# Patient Record
Sex: Female | Born: 1950 | Race: White | Hispanic: No | Marital: Married | Smoking: Never smoker
Health system: Southern US, Community
[De-identification: ages and names within clinical notes are randomized; demographics above are authoritative.]

## PROBLEM LIST (undated history)

## (undated) DIAGNOSIS — C4491 Basal cell carcinoma of skin, unspecified: Secondary | ICD-10-CM

## (undated) DIAGNOSIS — M8580 Other specified disorders of bone density and structure, unspecified site: Secondary | ICD-10-CM

## (undated) DIAGNOSIS — B019 Varicella without complication: Secondary | ICD-10-CM

## (undated) DIAGNOSIS — C439 Malignant melanoma of skin, unspecified: Secondary | ICD-10-CM

## (undated) DIAGNOSIS — R011 Cardiac murmur, unspecified: Secondary | ICD-10-CM

## (undated) DIAGNOSIS — K219 Gastro-esophageal reflux disease without esophagitis: Secondary | ICD-10-CM

## (undated) HISTORY — PX: BLADDER SUSPENSION: SHX72

## (undated) HISTORY — PX: ABDOMINAL HYSTERECTOMY: SHX81

## (undated) HISTORY — PX: SKIN CANCER EXCISION: SHX779

## (undated) HISTORY — DX: Varicella without complication: B01.9

## (undated) HISTORY — DX: Gastro-esophageal reflux disease without esophagitis: K21.9

## (undated) HISTORY — DX: Malignant melanoma of skin, unspecified: C43.9

## (undated) HISTORY — DX: Other specified disorders of bone density and structure, unspecified site: M85.80

## (undated) HISTORY — DX: Basal cell carcinoma of skin, unspecified: C44.91

## (undated) HISTORY — PX: COLONOSCOPY: SHX174

## (undated) HISTORY — DX: Cardiac murmur, unspecified: R01.1

---

## 2014-02-08 DIAGNOSIS — L821 Other seborrheic keratosis: Secondary | ICD-10-CM | POA: Diagnosis not present

## 2014-02-08 DIAGNOSIS — D229 Melanocytic nevi, unspecified: Secondary | ICD-10-CM | POA: Diagnosis not present

## 2014-02-08 DIAGNOSIS — Z85828 Personal history of other malignant neoplasm of skin: Secondary | ICD-10-CM | POA: Diagnosis not present

## 2014-02-08 DIAGNOSIS — L570 Actinic keratosis: Secondary | ICD-10-CM | POA: Diagnosis not present

## 2014-02-08 DIAGNOSIS — L738 Other specified follicular disorders: Secondary | ICD-10-CM | POA: Diagnosis not present

## 2014-03-23 ENCOUNTER — Encounter: Payer: Self-pay | Admitting: Family Medicine

## 2014-03-23 ENCOUNTER — Ambulatory Visit (INDEPENDENT_AMBULATORY_CARE_PROVIDER_SITE_OTHER): Payer: Medicare Other | Admitting: Family Medicine

## 2014-03-23 ENCOUNTER — Other Ambulatory Visit (HOSPITAL_COMMUNITY)
Admission: RE | Admit: 2014-03-23 | Discharge: 2014-03-23 | Disposition: A | Discharge status: Home / Self Care | Payer: Medicare Other | Source: Ambulatory Visit | Attending: Family Medicine | Admitting: Family Medicine

## 2014-03-23 VITALS — BP 132/76 | HR 73 | Temp 98.3°F | Ht 61.5 in | Wt 156.0 lb

## 2014-03-23 DIAGNOSIS — Z124 Encounter for screening for malignant neoplasm of cervix: Secondary | ICD-10-CM

## 2014-03-23 DIAGNOSIS — Z1231 Encounter for screening mammogram for malignant neoplasm of breast: Secondary | ICD-10-CM

## 2014-03-23 DIAGNOSIS — Z1151 Encounter for screening for human papillomavirus (HPV): Secondary | ICD-10-CM | POA: Insufficient documentation

## 2014-03-23 DIAGNOSIS — K589 Irritable bowel syndrome without diarrhea: Secondary | ICD-10-CM | POA: Diagnosis not present

## 2014-03-23 DIAGNOSIS — E2839 Other primary ovarian failure: Secondary | ICD-10-CM

## 2014-03-23 DIAGNOSIS — N819 Female genital prolapse, unspecified: Secondary | ICD-10-CM | POA: Diagnosis not present

## 2014-03-23 DIAGNOSIS — Z23 Encounter for immunization: Secondary | ICD-10-CM

## 2014-03-23 DIAGNOSIS — Z136 Encounter for screening for cardiovascular disorders: Secondary | ICD-10-CM

## 2014-03-23 DIAGNOSIS — Z0000 Encounter for general adult medical examination without abnormal findings: Secondary | ICD-10-CM

## 2014-03-23 DIAGNOSIS — B351 Tinea unguium: Secondary | ICD-10-CM

## 2014-03-23 DIAGNOSIS — IMO0002 Reserved for concepts with insufficient information to code with codable children: Secondary | ICD-10-CM

## 2014-03-23 MED ORDER — ZOSTER VACCINE LIVE 19400 UNT/0.65ML ~~LOC~~ SOLR: 0.6500 mL | Freq: Once | SUBCUTANEOUS | Status: DC

## 2014-03-23 MED ORDER — EFINACONAZOLE 10 % EX SOLN: CUTANEOUS | Status: DC

## 2014-03-23 NOTE — Patient Instructions (Signed)
Preventive Care for Adults A healthy lifestyle and preventive care can promote health and wellness. Preventive health guidelines for women include the following key practices.  A routine yearly physical is a good way to check with your health care provider about your health and preventive screening. It is a chance to share any concerns and updates on your health and to receive a thorough exam.  Visit your dentist for a routine exam and preventive care every 6 months. Brush your teeth twice a day and floss once a day. Good oral hygiene prevents tooth decay and gum disease.  The frequency of eye exams is based on your age, health, family medical history, use of contact lenses, and other factors. Follow your health care provider's recommendations for frequency of eye exams.  Eat a healthy diet. Foods like vegetables, fruits, whole grains, low-fat dairy products, and lean protein foods contain the nutrients you need without too many calories. Decrease your intake of foods high in solid fats, added sugars, and salt. Eat the right amount of calories for you.Get information about a proper diet from your health care provider, if necessary.  Regular physical exercise is one of the most important things you can do for your health. Most adults should get at least 150 minutes of moderate-intensity exercise (any activity that increases your heart rate and causes you to sweat) each week. In addition, most adults need muscle-strengthening exercises on 2 or more days a week.  Maintain a healthy weight. The body mass index (BMI) is a screening tool to identify possible weight problems. It provides an estimate of body fat based on height and weight. Your health care provider can find your BMI and can help you achieve or maintain a healthy weight.For adults 20 years and older:  A BMI below 18.5 is considered underweight.  A BMI of 18.5 to 24.9 is normal.  A BMI of 25 to 29.9 is considered overweight.  A BMI of  30 and above is considered obese.  Maintain normal blood lipids and cholesterol levels by exercising and minimizing your intake of saturated fat. Eat a balanced diet with plenty of fruit and vegetables. Blood tests for lipids and cholesterol should begin at age 76 and be repeated every 5 years. If your lipid or cholesterol levels are high, you are over 50, or you are at high risk for heart disease, you may need your cholesterol levels checked more frequently.Ongoing high lipid and cholesterol levels should be treated with medicines if diet and exercise are not working.  If you smoke, find out from your health care provider how to quit. If you do not use tobacco, do not start.  Lung cancer screening is recommended for adults aged 22-80 years who are at high risk for developing lung cancer because of a history of smoking. A yearly low-dose CT scan of the lungs is recommended for people who have at least a 30-pack-year history of smoking and are a current smoker or have quit within the past 15 years. A pack year of smoking is smoking an average of 1 pack of cigarettes a day for 1 year (for example: 1 pack a day for 30 years or 2 packs a day for 15 years). Yearly screening should continue until the smoker has stopped smoking for at least 15 years. Yearly screening should be stopped for people who develop a health problem that would prevent them from having lung cancer treatment.  If you are pregnant, do not drink alcohol. If you are breastfeeding,  be very cautious about drinking alcohol. If you are not pregnant and choose to drink alcohol, do not have more than 1 drink per day. One drink is considered to be 12 ounces (355 mL) of beer, 5 ounces (148 mL) of wine, or 1.5 ounces (44 mL) of liquor.  Avoid use of street drugs. Do not share needles with anyone. Ask for help if you need support or instructions about stopping the use of drugs.  High blood pressure causes heart disease and increases the risk of  stroke. Your blood pressure should be checked at least every 1 to 2 years. Ongoing high blood pressure should be treated with medicines if weight loss and exercise do not work.  If you are 75-52 years old, ask your health care provider if you should take aspirin to prevent strokes.  Diabetes screening involves taking a blood sample to check your fasting blood sugar level. This should be done once every 3 years, after age 15, if you are within normal weight and without risk factors for diabetes. Testing should be considered at a younger age or be carried out more frequently if you are overweight and have at least 1 risk factor for diabetes.  Breast cancer screening is essential preventive care for women. You should practice "breast self-awareness." This means understanding the normal appearance and feel of your breasts and may include breast self-examination. Any changes detected, no matter how small, should be reported to a health care provider. Women in their 58s and 30s should have a clinical breast exam (CBE) by a health care provider as part of a regular health exam every 1 to 3 years. After age 16, women should have a CBE every year. Starting at age 53, women should consider having a mammogram (breast X-ray test) every year. Women who have a family history of breast cancer should talk to their health care provider about genetic screening. Women at a high risk of breast cancer should talk to their health care providers about having an MRI and a mammogram every year.  Breast cancer gene (BRCA)-related cancer risk assessment is recommended for women who have family members with BRCA-related cancers. BRCA-related cancers include breast, ovarian, tubal, and peritoneal cancers. Having family members with these cancers may be associated with an increased risk for harmful changes (mutations) in the breast cancer genes BRCA1 and BRCA2. Results of the assessment will determine the need for genetic counseling and  BRCA1 and BRCA2 testing.  Routine pelvic exams to screen for cancer are no longer recommended for nonpregnant women who are considered low risk for cancer of the pelvic organs (ovaries, uterus, and vagina) and who do not have symptoms. Ask your health care provider if a screening pelvic exam is right for you.  If you have had past treatment for cervical cancer or a condition that could lead to cancer, you need Pap tests and screening for cancer for at least 20 years after your treatment. If Pap tests have been discontinued, your risk factors (such as having a new sexual partner) need to be reassessed to determine if screening should be resumed. Some women have medical problems that increase the chance of getting cervical cancer. In these cases, your health care provider may recommend more frequent screening and Pap tests.  The HPV test is an additional test that may be used for cervical cancer screening. The HPV test looks for the virus that can cause the cell changes on the cervix. The cells collected during the Pap test can be  tested for HPV. The HPV test could be used to screen women aged 30 years and older, and should be used in women of any age who have unclear Pap test results. After the age of 30, women should have HPV testing at the same frequency as a Pap test.  Colorectal cancer can be detected and often prevented. Most routine colorectal cancer screening begins at the age of 50 years and continues through age 75 years. However, your health care provider may recommend screening at an earlier age if you have risk factors for colon cancer. On a yearly basis, your health care provider may provide home test kits to check for hidden blood in the stool. Use of a small camera at the end of a tube, to directly examine the colon (sigmoidoscopy or colonoscopy), can detect the earliest forms of colorectal cancer. Talk to your health care provider about this at age 50, when routine screening begins. Direct  exam of the colon should be repeated every 5-10 years through age 75 years, unless early forms of pre-cancerous polyps or small growths are found.  People who are at an increased risk for hepatitis B should be screened for this virus. You are considered at high risk for hepatitis B if:  You were born in a country where hepatitis B occurs often. Talk with your health care provider about which countries are considered high risk.  Your parents were born in a high-risk country and you have not received a shot to protect against hepatitis B (hepatitis B vaccine).  You have HIV or AIDS.  You use needles to inject street drugs.  You live with, or have sex with, someone who has hepatitis B.  You get hemodialysis treatment.  You take certain medicines for conditions like cancer, organ transplantation, and autoimmune conditions.  Hepatitis C blood testing is recommended for all people born from 1945 through 1965 and any individual with known risks for hepatitis C.  Practice safe sex. Use condoms and avoid high-risk sexual practices to reduce the spread of sexually transmitted infections (STIs). STIs include gonorrhea, chlamydia, syphilis, trichomonas, herpes, HPV, and human immunodeficiency virus (HIV). Herpes, HIV, and HPV are viral illnesses that have no cure. They can result in disability, cancer, and death.  You should be screened for sexually transmitted illnesses (STIs) including gonorrhea and chlamydia if:  You are sexually active and are younger than 24 years.  You are older than 24 years and your health care provider tells you that you are at risk for this type of infection.  Your sexual activity has changed since you were last screened and you are at an increased risk for chlamydia or gonorrhea. Ask your health care provider if you are at risk.  If you are at risk of being infected with HIV, it is recommended that you take a prescription medicine daily to prevent HIV infection. This is  called preexposure prophylaxis (PrEP). You are considered at risk if:  You are a heterosexual woman, are sexually active, and are at increased risk for HIV infection.  You take drugs by injection.  You are sexually active with a partner who has HIV.  Talk with your health care provider about whether you are at high risk of being infected with HIV. If you choose to begin PrEP, you should first be tested for HIV. You should then be tested every 3 months for as long as you are taking PrEP.  Osteoporosis is a disease in which the bones lose minerals and strength   with aging. This can result in serious bone fractures or breaks. The risk of osteoporosis can be identified using a bone density scan. Women ages 65 years and over and women at risk for fractures or osteoporosis should discuss screening with their health care providers. Ask your health care provider whether you should take a calcium supplement or vitamin D to reduce the rate of osteoporosis.  Menopause can be associated with physical symptoms and risks. Hormone replacement therapy is available to decrease symptoms and risks. You should talk to your health care provider about whether hormone replacement therapy is right for you.  Use sunscreen. Apply sunscreen liberally and repeatedly throughout the day. You should seek shade when your shadow is shorter than you. Protect yourself by wearing long sleeves, pants, a wide-brimmed hat, and sunglasses year round, whenever you are outdoors.  Once a month, do a whole body skin exam, using a mirror to look at the skin on your back. Tell your health care provider of new moles, moles that have irregular borders, moles that are larger than a pencil eraser, or moles that have changed in shape or color.  Stay current with required vaccines (immunizations).  Influenza vaccine. All adults should be immunized every year.  Tetanus, diphtheria, and acellular pertussis (Td, Tdap) vaccine. Pregnant women should  receive 1 dose of Tdap vaccine during each pregnancy. The dose should be obtained regardless of the length of time since the last dose. Immunization is preferred during the 27th-36th week of gestation. An adult who has not previously received Tdap or who does not know her vaccine status should receive 1 dose of Tdap. This initial dose should be followed by tetanus and diphtheria toxoids (Td) booster doses every 10 years. Adults with an unknown or incomplete history of completing a 3-dose immunization series with Td-containing vaccines should begin or complete a primary immunization series including a Tdap dose. Adults should receive a Td booster every 10 years.  Varicella vaccine. An adult without evidence of immunity to varicella should receive 2 doses or a second dose if she has previously received 1 dose. Pregnant females who do not have evidence of immunity should receive the first dose after pregnancy. This first dose should be obtained before leaving the health care facility. The second dose should be obtained 4-8 weeks after the first dose.  Human papillomavirus (HPV) vaccine. Females aged 13-26 years who have not received the vaccine previously should obtain the 3-dose series. The vaccine is not recommended for use in pregnant females. However, pregnancy testing is not needed before receiving a dose. If a female is found to be pregnant after receiving a dose, no treatment is needed. In that case, the remaining doses should be delayed until after the pregnancy. Immunization is recommended for any person with an immunocompromised condition through the age of 26 years if she did not get any or all doses earlier. During the 3-dose series, the second dose should be obtained 4-8 weeks after the first dose. The third dose should be obtained 24 weeks after the first dose and 16 weeks after the second dose.  Zoster vaccine. One dose is recommended for adults aged 60 years or older unless certain conditions are  present.  Measles, mumps, and rubella (MMR) vaccine. Adults born before 1957 generally are considered immune to measles and mumps. Adults born in 1957 or later should have 1 or more doses of MMR vaccine unless there is a contraindication to the vaccine or there is laboratory evidence of immunity to   each of the three diseases. A routine second dose of MMR vaccine should be obtained at least 28 days after the first dose for students attending postsecondary schools, health care workers, or international travelers. People who received inactivated measles vaccine or an unknown type of measles vaccine during 1963-1967 should receive 2 doses of MMR vaccine. People who received inactivated mumps vaccine or an unknown type of mumps vaccine before 1979 and are at high risk for mumps infection should consider immunization with 2 doses of MMR vaccine. For females of childbearing age, rubella immunity should be determined. If there is no evidence of immunity, females who are not pregnant should be vaccinated. If there is no evidence of immunity, females who are pregnant should delay immunization until after pregnancy. Unvaccinated health care workers born before 1957 who lack laboratory evidence of measles, mumps, or rubella immunity or laboratory confirmation of disease should consider measles and mumps immunization with 2 doses of MMR vaccine or rubella immunization with 1 dose of MMR vaccine.  Pneumococcal 13-valent conjugate (PCV13) vaccine. When indicated, a person who is uncertain of her immunization history and has no record of immunization should receive the PCV13 vaccine. An adult aged 19 years or older who has certain medical conditions and has not been previously immunized should receive 1 dose of PCV13 vaccine. This PCV13 should be followed with a dose of pneumococcal polysaccharide (PPSV23) vaccine. The PPSV23 vaccine dose should be obtained at least 8 weeks after the dose of PCV13 vaccine. An adult aged 19  years or older who has certain medical conditions and previously received 1 or more doses of PPSV23 vaccine should receive 1 dose of PCV13. The PCV13 vaccine dose should be obtained 1 or more years after the last PPSV23 vaccine dose.  Pneumococcal polysaccharide (PPSV23) vaccine. When PCV13 is also indicated, PCV13 should be obtained first. All adults aged 65 years and older should be immunized. An adult younger than age 65 years who has certain medical conditions should be immunized. Any person who resides in a nursing home or long-term care facility should be immunized. An adult smoker should be immunized. People with an immunocompromised condition and certain other conditions should receive both PCV13 and PPSV23 vaccines. People with human immunodeficiency virus (HIV) infection should be immunized as soon as possible after diagnosis. Immunization during chemotherapy or radiation therapy should be avoided. Routine use of PPSV23 vaccine is not recommended for American Indians, [place] Natives, or people younger than 65 years unless there are medical conditions that require PPSV23 vaccine. When indicated, people who have unknown immunization and have no record of immunization should receive PPSV23 vaccine. One-time revaccination 5 years after the first dose of PPSV23 is recommended for people aged 19-64 years who have chronic kidney failure, nephrotic syndrome, asplenia, or immunocompromised conditions. People who received 1-2 doses of PPSV23 before age 65 years should receive another dose of PPSV23 vaccine at age 65 years or later if at least 5 years have passed since the previous dose. Doses of PPSV23 are not needed for people immunized with PPSV23 at or after age 65 years.  Meningococcal vaccine. Adults with asplenia or persistent complement component deficiencies should receive 2 doses of quadrivalent meningococcal conjugate (MenACWY-D) vaccine. The doses should be obtained at least 2 months apart.  Microbiologists working with certain meningococcal bacteria, military recruits, people at risk during an outbreak, and people who travel to or live in countries with a high rate of meningitis should be immunized. A first-year college student up through age   21 years who is living in a residence hall should receive a dose if she did not receive a dose on or after her 16th birthday. Adults who have certain high-risk conditions should receive one or more doses of vaccine.  Hepatitis A vaccine. Adults who wish to be protected from this disease, have certain high-risk conditions, work with hepatitis A-infected animals, work in hepatitis A research labs, or travel to or work in countries with a high rate of hepatitis A should be immunized. Adults who were previously unvaccinated and who anticipate close contact with an international adoptee during the first 60 days after arrival in the [place] from a country with a high rate of hepatitis A should be immunized.  Hepatitis B vaccine. Adults who wish to be protected from this disease, have certain high-risk conditions, may be exposed to blood or other infectious body fluids, are household contacts or sex partners of hepatitis B positive people, are clients or workers in certain care facilities, or travel to or work in countries with a high rate of hepatitis B should be immunized.  Haemophilus influenzae type b (Hib) vaccine. A previously unvaccinated person with asplenia or sickle cell disease or having a scheduled splenectomy should receive 1 dose of Hib vaccine. Regardless of previous immunization, a recipient of a hematopoietic stem cell transplant should receive a 3-dose series 6-12 months after her successful transplant. Hib vaccine is not recommended for adults with HIV infection. Preventive Services / Frequency Ages 64 to 68 years  Blood pressure check.** / Every 1 to 2 years.  Lipid and cholesterol check.** / Every 5 years beginning at age  22.  Clinical breast exam.** / Every 3 years for women in their 88s and 53s.  BRCA-related cancer risk assessment.** / For women who have family members with a BRCA-related cancer (breast, ovarian, tubal, or peritoneal cancers).  Pap test.** / Every 2 years from ages 90 through 51. Every 3 years starting at age 21 through age 56 or 3 with a history of 3 consecutive normal Pap tests.  HPV screening.** / Every 3 years from ages 24 through ages 1 to 46 with a history of 3 consecutive normal Pap tests.  Hepatitis C blood test.** / For any individual with known risks for hepatitis C.  Skin self-exam. / Monthly.  Influenza vaccine. / Every year.  Tetanus, diphtheria, and acellular pertussis (Tdap, Td) vaccine.** / Consult your health care provider. Pregnant women should receive 1 dose of Tdap vaccine during each pregnancy. 1 dose of Td every 10 years.  Varicella vaccine.** / Consult your health care provider. Pregnant females who do not have evidence of immunity should receive the first dose after pregnancy.  HPV vaccine. / 3 doses over 6 months, if 72 and younger. The vaccine is not recommended for use in pregnant females. However, pregnancy testing is not needed before receiving a dose.  Measles, mumps, rubella (MMR) vaccine.** / You need at least 1 dose of MMR if you were born in 1957 or later. You may also need a 2nd dose. For females of childbearing age, rubella immunity should be determined. If there is no evidence of immunity, females who are not pregnant should be vaccinated. If there is no evidence of immunity, females who are pregnant should delay immunization until after pregnancy.  Pneumococcal 13-valent conjugate (PCV13) vaccine.** / Consult your health care provider.  Pneumococcal polysaccharide (PPSV23) vaccine.** / 1 to 2 doses if you smoke cigarettes or if you have certain conditions.  Meningococcal vaccine.** /  1 dose if you are age 19 to 21 years and a first-year college  student living in a residence hall, or have one of several medical conditions, you need to get vaccinated against meningococcal disease. You may also need additional booster doses.  Hepatitis A vaccine.** / Consult your health care provider.  Hepatitis B vaccine.** / Consult your health care provider.  Haemophilus influenzae type b (Hib) vaccine.** / Consult your health care provider. Ages 40 to 64 years  Blood pressure check.** / Every 1 to 2 years.  Lipid and cholesterol check.** / Every 5 years beginning at age 20 years.  Lung cancer screening. / Every year if you are aged 55-80 years and have a 30-pack-year history of smoking and currently smoke or have quit within the past 15 years. Yearly screening is stopped once you have quit smoking for at least 15 years or develop a health problem that would prevent you from having lung cancer treatment.  Clinical breast exam.** / Every year after age 40 years.  BRCA-related cancer risk assessment.** / For women who have family members with a BRCA-related cancer (breast, ovarian, tubal, or peritoneal cancers).  Mammogram.** / Every year beginning at age 40 years and continuing for as long as you are in good health. Consult with your health care provider.  Pap test.** / Every 3 years starting at age 30 years through age 65 or 70 years with a history of 3 consecutive normal Pap tests.  HPV screening.** / Every 3 years from ages 30 years through ages 65 to 70 years with a history of 3 consecutive normal Pap tests.  Fecal occult blood test (FOBT) of stool. / Every year beginning at age 50 years and continuing until age 75 years. You may not need to do this test if you get a colonoscopy every 10 years.  Flexible sigmoidoscopy or colonoscopy.** / Every 5 years for a flexible sigmoidoscopy or every 10 years for a colonoscopy beginning at age 50 years and continuing until age 75 years.  Hepatitis C blood test.** / For all people born from 1945 through  1965 and any individual with known risks for hepatitis C.  Skin self-exam. / Monthly.  Influenza vaccine. / Every year.  Tetanus, diphtheria, and acellular pertussis (Tdap/Td) vaccine.** / Consult your health care provider. Pregnant women should receive 1 dose of Tdap vaccine during each pregnancy. 1 dose of Td every 10 years.  Varicella vaccine.** / Consult your health care provider. Pregnant females who do not have evidence of immunity should receive the first dose after pregnancy.  Zoster vaccine.** / 1 dose for adults aged 60 years or older.  Measles, mumps, rubella (MMR) vaccine.** / You need at least 1 dose of MMR if you were born in 1957 or later. You may also need a 2nd dose. For females of childbearing age, rubella immunity should be determined. If there is no evidence of immunity, females who are not pregnant should be vaccinated. If there is no evidence of immunity, females who are pregnant should delay immunization until after pregnancy.  Pneumococcal 13-valent conjugate (PCV13) vaccine.** / Consult your health care provider.  Pneumococcal polysaccharide (PPSV23) vaccine.** / 1 to 2 doses if you smoke cigarettes or if you have certain conditions.  Meningococcal vaccine.** / Consult your health care provider.  Hepatitis A vaccine.** / Consult your health care provider.  Hepatitis B vaccine.** / Consult your health care provider.  Haemophilus influenzae type b (Hib) vaccine.** / Consult your health care provider. Ages 65   years and over  Blood pressure check.** / Every 1 to 2 years.  Lipid and cholesterol check.** / Every 5 years beginning at age 22 years.  Lung cancer screening. / Every year if you are aged 73-80 years and have a 30-pack-year history of smoking and currently smoke or have quit within the past 15 years. Yearly screening is stopped once you have quit smoking for at least 15 years or develop a health problem that would prevent you from having lung cancer  treatment.  Clinical breast exam.** / Every year after age 4 years.  BRCA-related cancer risk assessment.** / For women who have family members with a BRCA-related cancer (breast, ovarian, tubal, or peritoneal cancers).  Mammogram.** / Every year beginning at age 40 years and continuing for as long as you are in good health. Consult with your health care provider.  Pap test.** / Every 3 years starting at age 9 years through age 34 or 91 years with 3 consecutive normal Pap tests. Testing can be stopped between 65 and 70 years with 3 consecutive normal Pap tests and no abnormal Pap or HPV tests in the past 10 years.  HPV screening.** / Every 3 years from ages 57 years through ages 64 or 45 years with a history of 3 consecutive normal Pap tests. Testing can be stopped between 65 and 70 years with 3 consecutive normal Pap tests and no abnormal Pap or HPV tests in the past 10 years.  Fecal occult blood test (FOBT) of stool. / Every year beginning at age 15 years and continuing until age 17 years. You may not need to do this test if you get a colonoscopy every 10 years.  Flexible sigmoidoscopy or colonoscopy.** / Every 5 years for a flexible sigmoidoscopy or every 10 years for a colonoscopy beginning at age 86 years and continuing until age 71 years.  Hepatitis C blood test.** / For all people born from [birth year] through 1967 and any individual with known risks for hepatitis C.  Osteoporosis screening.** / A one-time screening for women ages 83 years and over and women at risk for fractures or osteoporosis.  Skin self-exam. / Monthly.  Influenza vaccine. / Every year.  Tetanus, diphtheria, and acellular pertussis (Tdap/Td) vaccine.** / 1 dose of Td every 10 years.  Varicella vaccine.** / Consult your health care provider.  Zoster vaccine.** / 1 dose for adults aged 61 years or older.  Pneumococcal 13-valent conjugate (PCV13) vaccine.** / Consult your health care provider.  Pneumococcal  polysaccharide (PPSV23) vaccine.** / 1 dose for all adults aged 28 years and older.  Meningococcal vaccine.** / Consult your health care provider.  Hepatitis A vaccine.** / Consult your health care provider.  Hepatitis B vaccine.** / Consult your health care provider.  Haemophilus influenzae type b (Hib) vaccine.** / Consult your health care provider. ** Family history and personal history of risk and conditions may change your health care provider's recommendations. Document Released: 04/22/2001 Document Revised: 07/11/2013 Document Reviewed: 07/22/2010 [facility] Patient Information 2015 [place], [place]. This information is not intended to replace advice given to you by your health care provider. Make sure you discuss any questions you have with your health care provider.

## 2014-03-23 NOTE — Progress Notes (Signed)
Subjective:    Margaret Foster is a 64 y.o. female who presents for Medicare Annual/Subsequent preventive examination.  Preventive Screening-Counseling & Management  Tobacco History  Smoking status  . Never Smoker   Smokeless tobacco  . Never Used     Problems Prior to Visit 1.   Current Problems (verified) Patient Active Problem List   Diagnosis Date Noted  . Pelvic prolapse 03/23/2014  . IBS (irritable bowel syndrome) 03/23/2014    Medications Prior to Visit No current outpatient prescriptions on file prior to visit.   No current facility-administered medications on file prior to visit.    Current Medications (verified) Current Outpatient Prescriptions  Medication Sig Dispense Refill  . aspirin 325 MG tablet Take 325 mg by mouth daily.    . Efinaconazole (JUBLIA) 10 % SOLN Apply once daily for 48 weeks 8 mL 3  . zoster vaccine live, PF, (ZOSTAVAX) 54270 UNT/0.65ML injection Inject 19,400 Units into the skin once. 1 vial 0   No current facility-administered medications for this visit.     Allergies (verified) Review of patient's allergies indicates not on file.   PAST HISTORY  Family History Family History  Problem Relation Age of Onset  . Arthritis Mother   . Diabetes Brother     Social History History  Substance Use Topics  . Smoking status: Never Smoker   . Smokeless tobacco: Never Used  . Alcohol Use: 0.0 oz/week    0 Not specified per week     Comment: Socially     Are there smokers in your home (other than you)? No  Risk Factors Current exercise habits: The patient does not participate in regular exercise at present.  Dietary issues discussed: na   Cardiac risk factors: advanced age (older than 67 for men, 2 for women) and sedentary lifestyle.  Depression Screen (Note: if answer to either of the following is "Yes", a more complete depression screening is indicated)   Over the past two weeks, have you felt down, depressed or hopeless?  No  Over the past two weeks, have you felt little interest or pleasure in doing things? No  Have you lost interest or pleasure in daily life? No  Do you often feel hopeless? No  Do you cry easily over simple problems? No  Activities of Daily Living In your present state of health, do you have any difficulty performing the following activities?:  Driving? No Managing money?  No Feeding yourself? No Getting from bed to chair? No Climbing a flight of stairs? No Preparing food and eating?: No Bathing or showering? No Getting dressed: No Getting to the toilet? No Using the toilet:No Moving around from place to place: No In the past year have you fallen or had a near fall?:No   Are you sexually active?  Yes  Do you have more than one partner?  No  Hearing Difficulties: No Do you often ask people to speak up or repeat themselves? No Do you experience ringing or noises in your ears? No Do you have difficulty understanding soft or whispered voices? No   Do you feel that you have a problem with memory? No  Do you often misplace items? No  Do you feel safe at home?  Yes  Cognitive Testing  Alert? Yes  Normal Appearance?Yes  Oriented to person? Yes  Place? Yes   Time? Yes  Recall of three objects?  Yes  Can perform simple calculations? Yes  Displays appropriate judgment?Yes  Can read the correct time from  a watch face?Yes   Advanced Directives have been discussed with the patient? Yes  List the Names of Other Physician/Practitioners you currnently use: 1.  none  Indicate any recent Medical Services you may have received from other than [facility] providers in the past year (date may be approximate).  Immunization History  Administered Date(s) Administered  . Tdap 07/03/2011    Screening Tests Health Maintenance  Topic Date Due  . MAMMOGRAM  02/15/1999  . COLONOSCOPY  02/15/1999  . ZOSTAVAX  02/14/2009  . DEXA SCAN  09/21/2014 (Originally 02/14/2014)  . INFLUENZA VACCINE   03/24/2015 (Originally 10/08/2013)  . PNEUMOCOCCAL POLYSACCHARIDE VACCINE AGE 31 AND OVER  03/24/2015 (Originally 02/14/2014)  . TETANUS/TDAP  07/02/2021    All answers were reviewed with the patient and necessary referrals were made:  Garnet Koyanagi, DO   03/23/2014   History reviewed:  She  has a past medical history of Chicken pox; GERD (gastroesophageal reflux disease); Skin cancer, basal cell; and Skin cancer (melanoma). She  does not have any pertinent problems on file. She  has past surgical history that includes Skin cancer excision and Abdominal hysterectomy. Her family history includes Arthritis in her mother; Diabetes in her brother. She  reports that she has never smoked. She has never used smokeless tobacco. She reports that she drinks alcohol. She reports that she does not use illicit drugs. She has a current medication list which includes the following prescription(s): aspirin, efinaconazole, and zoster vaccine live (pf). No current outpatient prescriptions on file prior to visit.   No current facility-administered medications on file prior to visit.   She has no allergies on file.  Review of Systems  Review of Systems  Constitutional: Negative for activity change, appetite change and fatigue.  HENT: Negative for hearing loss, congestion, tinnitus and ear discharge.   Eyes: Negative for visual disturbance (see optho q1y -- vision corrected to 20/20 with glasses).  Respiratory: Negative for cough, chest tightness and shortness of breath.   Cardiovascular: Negative for chest pain, palpitations and leg swelling.  Gastrointestinal: Negative for abdominal pain, diarrhea, constipation and abdominal distention.  Genitourinary: Negative for urgency, frequency, decreased urine volume and difficulty urinating.  Musculoskeletal: Negative for back pain, arthralgias and gait problem.  Skin: Negative for color change, pallor and rash.  Neurological: Negative for dizziness,  light-headedness, numbness and headaches.  Hematological: Negative for adenopathy. Does not bruise/bleed easily.  Psychiatric/Behavioral: Negative for suicidal ideas, confusion, sleep disturbance, self-injury, dysphoric mood, decreased concentration and agitation.  Pt is able to read and write and can do all ADLs No risk for falling No abuse/ violence in home     Objective:     Vision by Snellen chart: opth  Body mass index is 29 kg/(m^2). BP 132/76 mmHg  Pulse 73  Temp(Src) 98.3 F (36.8 C) (Oral)  Ht 5' 1.5" (1.562 m)  Wt 156 lb (70.761 kg)  BMI 29.00 kg/m2  SpO2 96%  BP 132/76 mmHg  Pulse 73  Temp(Src) 98.3 F (36.8 C) (Oral)  Ht 5' 1.5" (1.562 m)  Wt 156 lb (70.761 kg)  BMI 29.00 kg/m2  SpO2 96% General appearance: alert, cooperative, appears stated age and no distress Head: Normocephalic, without obvious abnormality, atraumatic Eyes: conjunctivae/corneas clear. PERRL, EOM's intact. Fundi benign. Ears: normal TM's and external ear canals both ears Nose: Nares normal. Septum midline. Mucosa normal. No drainage or sinus tenderness. Throat: lips, mucosa, and tongue normal; teeth and gums normal Neck: no adenopathy, no carotid bruit, no JVD, supple, symmetrical,  trachea midline and thyroid not enlarged, symmetric, no tenderness/mass/nodules Back: symmetric, no curvature. ROM normal. No CVA tenderness. Lungs: clear to auscultation bilaterally Breasts: normal appearance, no masses or tenderness Heart: regular rate and rhythm, S1, S2 normal, no murmur, click, rub or gallop Abdomen: soft, non-tender; bowel sounds normal; no masses,  no organomegaly Pelvic: cervix normal in appearance, external genitalia normal, no adnexal masses or tenderness, no cervical motion tenderness, rectovaginal septum normal, uterus normal size, shape, and consistency, vagina normal without discharge and heme neg brown stool, pap done--- + cysstocele, rectpce;e Extremities: extremities normal,  atraumatic, no cyanosis or edema Pulses: 2+ and symmetric Skin: Skin color, texture, turgor normal. No rashes or lesions Lymph nodes: Cervical, supraclavicular, and axillary nodes normal. Neurologic: Alert and oriented X 3, normal strength and tone. Normal symmetric reflexes. Normal coordination and gait Psych-- no depression, no anxiety      Assessment:     cpe      Plan:     During the course of the visit the patient was educated and counseled about appropriate screening and preventive services including:    Pneumococcal vaccine   Influenza vaccine  Screening electrocardiogram  Screening mammography  Bone densitometry screening  Glaucoma screening  Advanced directives: has an advanced directive - a copy HAS NOT been provided.  Diet review for nutrition referral? Yes ___  Not Indicated __x__   Patient Instructions (the written plan) was given to the patient.  Medicare Attestation I have personally reviewed: The patient's medical and social history Their use of alcohol, tobacco or illicit drugs Their current medications and supplements The patient's functional ability including ADLs,fall risks, home safety risks, cognitive, and hearing and visual impairment Diet and physical activities Evidence for depression or mood disorders  The patient's weight, height, BMI, and visual acuity have been recorded in the chart.  I have made referrals, counseling, and provided education to the patient based on review of the above and I have provided the patient with a written personalized care plan for preventive services.    1. Estrogen deficiency Ca and vita d daily - DG Bone Density; Future  2. Welcome to Medicare preventive visit   - EKG 12-Lead  3. Encounter for screening mammogram for breast cancer   - MM Digital Screening; Future  4. Pelvic prolapse    5. Need for shingles vaccine   - zoster vaccine live, PF, (ZOSTAVAX) 95093 UNT/0.65ML injection; Inject  19,400 Units into the skin once.  Dispense: 1 vial; Refill: 0  7. Preventative health care   - Ambulatory referral to Gastroenterology  8. Cystocele   - Ambulatory referral to Urology - Basic metabolic panel; Future - CBC with Differential; Future - Hepatic function panel; Future - Lipid panel; Future - POCT urinalysis dipstick; Future  9. Onychomycosis   - Efinaconazole (JUBLIA) 10 % SOLN; Apply once daily for 48 weeks  Dispense: 8 mL; Refill: 3 - Basic metabolic panel; Future - CBC with Differential; Future - Hepatic function panel; Future - Lipid panel; Future - POCT urinalysis dipstick; Future  10. Screening, ischemic heart disease   - Lipid panel; Future  11. Screening for malignant neoplasm of cervix   - Cytology - PAP   Garnet Koyanagi, DO   03/23/2014

## 2014-03-23 NOTE — Progress Notes (Signed)
Pre visit review using our clinic review tool, if applicable. No additional management support is needed unless otherwise documented below in the visit note. 

## 2014-03-27 ENCOUNTER — Other Ambulatory Visit (INDEPENDENT_AMBULATORY_CARE_PROVIDER_SITE_OTHER): Payer: Medicare Other

## 2014-03-27 DIAGNOSIS — B351 Tinea unguium: Secondary | ICD-10-CM

## 2014-03-27 DIAGNOSIS — K589 Irritable bowel syndrome without diarrhea: Secondary | ICD-10-CM

## 2014-03-27 DIAGNOSIS — IMO0002 Reserved for concepts with insufficient information to code with codable children: Secondary | ICD-10-CM

## 2014-03-27 DIAGNOSIS — Z136 Encounter for screening for cardiovascular disorders: Secondary | ICD-10-CM

## 2014-03-27 DIAGNOSIS — N8110 Cystocele, unspecified: Secondary | ICD-10-CM

## 2014-03-27 LAB — CBC WITH DIFFERENTIAL/PLATELET
Basophils Absolute: 0 10*3/uL (ref 0.0–0.1)
Basophils Relative: 0.6 % (ref 0.0–3.0)
Eosinophils Absolute: 0.2 10*3/uL (ref 0.0–0.7)
Eosinophils Relative: 5.2 % — ABNORMAL HIGH (ref 0.0–5.0)
HCT: 42.4 % (ref 36.0–46.0)
Hemoglobin: 13.8 g/dL (ref 12.0–15.0)
Lymphocytes Relative: 40 % (ref 12.0–46.0)
Lymphs Abs: 1.8 10*3/uL (ref 0.7–4.0)
MCHC: 32.5 g/dL (ref 30.0–36.0)
MCV: 97.9 fl (ref 78.0–100.0)
Monocytes Absolute: 0.4 10*3/uL (ref 0.1–1.0)
Monocytes Relative: 9 % (ref 3.0–12.0)
Neutro Abs: 2 10*3/uL (ref 1.4–7.7)
Neutrophils Relative %: 45.2 % (ref 43.0–77.0)
Platelets: 266 10*3/uL (ref 150.0–400.0)
RBC: 4.33 Mil/uL (ref 3.87–5.11)
RDW: 14.1 % (ref 11.5–15.5)
WBC: 4.4 10*3/uL (ref 4.0–10.5)

## 2014-03-27 LAB — HEPATIC FUNCTION PANEL
ALT: 21 U/L (ref 0–35)
AST: 22 U/L (ref 0–37)
Albumin: 4.2 g/dL (ref 3.5–5.2)
Alkaline Phosphatase: 63 U/L (ref 39–117)
Bilirubin, Direct: 0.1 mg/dL (ref 0.0–0.3)
Total Bilirubin: 0.7 mg/dL (ref 0.2–1.2)
Total Protein: 7.1 g/dL (ref 6.0–8.3)

## 2014-03-27 LAB — BASIC METABOLIC PANEL
BUN: 14 mg/dL (ref 6–23)
CO2: 28 mEq/L (ref 19–32)
Calcium: 9.2 mg/dL (ref 8.4–10.5)
Chloride: 104 mEq/L (ref 96–112)
Creatinine, Ser: 0.76 mg/dL (ref 0.40–1.20)
GFR: 81.15 mL/min (ref >60.00)
Glucose, Bld: 107 mg/dL — ABNORMAL HIGH (ref 70–99)
Potassium: 3.6 mEq/L (ref 3.5–5.1)
Sodium: 138 mEq/L (ref 135–145)

## 2014-03-27 LAB — LIPID PANEL
Cholesterol: 256 mg/dL — ABNORMAL HIGH (ref 0–200)
HDL: 99.3 mg/dL (ref >39.00)
LDL Cholesterol: 140 mg/dL — ABNORMAL HIGH (ref 0–99)
NonHDL: 156.7
Total CHOL/HDL Ratio: 3
Triglycerides: 83 mg/dL (ref 0.0–149.0)
VLDL: 16.6 mg/dL (ref 0.0–40.0)

## 2014-03-27 LAB — CYTOLOGY - PAP

## 2014-03-28 ENCOUNTER — Encounter: Payer: Self-pay | Admitting: *Deleted

## 2014-03-29 ENCOUNTER — Telehealth: Payer: Self-pay | Admitting: Family Medicine

## 2014-03-29 NOTE — Telephone Encounter (Signed)
Labs have not been resulted. I have tried to contact the patient to make her aware. Message left to call the office     KP

## 2014-03-29 NOTE — Telephone Encounter (Signed)
Caller name:Ronisha Manual Relationship to patient:self Can be reached:[phone] or [phone] Pharmacy:  Reason for call: requesting lab results

## 2014-04-19 ENCOUNTER — Ambulatory Visit (INDEPENDENT_AMBULATORY_CARE_PROVIDER_SITE_OTHER): Payer: Medicare Other

## 2014-04-19 DIAGNOSIS — Z1231 Encounter for screening mammogram for malignant neoplasm of breast: Secondary | ICD-10-CM | POA: Diagnosis not present

## 2014-04-19 DIAGNOSIS — Z780 Asymptomatic menopausal state: Secondary | ICD-10-CM | POA: Diagnosis not present

## 2014-04-19 DIAGNOSIS — M859 Disorder of bone density and structure, unspecified: Secondary | ICD-10-CM

## 2014-04-19 DIAGNOSIS — E2839 Other primary ovarian failure: Secondary | ICD-10-CM

## 2014-04-27 ENCOUNTER — Telehealth: Payer: Self-pay | Admitting: Family Medicine

## 2014-04-27 NOTE — Telephone Encounter (Signed)
Overall good---- Cholesterol--- LDL goal < 100, HDL >40, TG < 150. Diet and exercise will increase HDL and decrease LDL and TG. Fish, Fish Oil, Flaxseed oil will also help increase the HDL and decrease Triglycerides.  Recheck labs in 1 year.  Notes Recorded by Rosalita Chessman, DO on 04/20/2014 at 8:48 PM Osteopenia--- take ca 1200 mg daily with 1000u vita D3 Recheck 2 years  Discussed with patient and she verbalized understanding.         KP

## 2014-04-27 NOTE — Telephone Encounter (Signed)
Caller name: [name] Relation to pt: self Call back number: [phone] Pharmacy:  Reason for call:   Has a couple of questions regarding last labs and is requesting mammogram results.

## 2014-08-10 DIAGNOSIS — D229 Melanocytic nevi, unspecified: Secondary | ICD-10-CM | POA: Diagnosis not present

## 2014-08-10 DIAGNOSIS — L578 Other skin changes due to chronic exposure to nonionizing radiation: Secondary | ICD-10-CM | POA: Diagnosis not present

## 2014-08-10 DIAGNOSIS — L821 Other seborrheic keratosis: Secondary | ICD-10-CM | POA: Diagnosis not present

## 2014-08-10 DIAGNOSIS — Z85828 Personal history of other malignant neoplasm of skin: Secondary | ICD-10-CM | POA: Diagnosis not present

## 2015-03-27 ENCOUNTER — Encounter: Payer: Self-pay | Admitting: Family Medicine

## 2015-03-27 ENCOUNTER — Ambulatory Visit (INDEPENDENT_AMBULATORY_CARE_PROVIDER_SITE_OTHER): Payer: Medicare Other | Admitting: Family Medicine

## 2015-03-27 VITALS — BP 132/70 | HR 64 | Temp 98.5°F | Ht 62.0 in | Wt 161.8 lb

## 2015-03-27 DIAGNOSIS — Z0000 Encounter for general adult medical examination without abnormal findings: Secondary | ICD-10-CM

## 2015-03-27 DIAGNOSIS — Z136 Encounter for screening for cardiovascular disorders: Secondary | ICD-10-CM | POA: Diagnosis not present

## 2015-03-27 DIAGNOSIS — E559 Vitamin D deficiency, unspecified: Secondary | ICD-10-CM | POA: Diagnosis not present

## 2015-03-27 DIAGNOSIS — Z1159 Encounter for screening for other viral diseases: Secondary | ICD-10-CM | POA: Diagnosis not present

## 2015-03-27 LAB — VITAMIN D 25 HYDROXY (VIT D DEFICIENCY, FRACTURES): VITD: 25.12 ng/mL — ABNORMAL LOW (ref 30.00–100.00)

## 2015-03-27 LAB — COMPREHENSIVE METABOLIC PANEL
ALT: 21 U/L (ref 0–35)
AST: 22 U/L (ref 0–37)
Albumin: 4.4 g/dL (ref 3.5–5.2)
Alkaline Phosphatase: 65 U/L (ref 39–117)
BUN: 12 mg/dL (ref 6–23)
CO2: 31 mEq/L (ref 19–32)
Calcium: 9.3 mg/dL (ref 8.4–10.5)
Chloride: 103 mEq/L (ref 96–112)
Creatinine, Ser: 0.78 mg/dL (ref 0.40–1.20)
GFR: 78.51 mL/min (ref >60.00)
Glucose, Bld: 99 mg/dL (ref 70–99)
Potassium: 4.4 mEq/L (ref 3.5–5.1)
Sodium: 140 mEq/L (ref 135–145)
Total Bilirubin: 0.6 mg/dL (ref 0.2–1.2)
Total Protein: 7.7 g/dL (ref 6.0–8.3)

## 2015-03-27 LAB — CBC WITH DIFFERENTIAL/PLATELET
Basophils Absolute: 0 10*3/uL (ref 0.0–0.1)
Basophils Relative: 0.6 % (ref 0.0–3.0)
Eosinophils Absolute: 0.2 10*3/uL (ref 0.0–0.7)
Eosinophils Relative: 3.4 % (ref 0.0–5.0)
HCT: 43.2 % (ref 36.0–46.0)
Hemoglobin: 14.3 g/dL (ref 12.0–15.0)
Lymphocytes Relative: 37.6 % (ref 12.0–46.0)
Lymphs Abs: 1.7 10*3/uL (ref 0.7–4.0)
MCHC: 33 g/dL (ref 30.0–36.0)
MCV: 97.5 fl (ref 78.0–100.0)
Monocytes Absolute: 0.4 10*3/uL (ref 0.1–1.0)
Monocytes Relative: 9 % (ref 3.0–12.0)
Neutro Abs: 2.2 10*3/uL (ref 1.4–7.7)
Neutrophils Relative %: 49.4 % (ref 43.0–77.0)
Platelets: 292 10*3/uL (ref 150.0–400.0)
RBC: 4.43 Mil/uL (ref 3.87–5.11)
RDW: 13.8 % (ref 11.5–15.5)
WBC: 4.5 10*3/uL (ref 4.0–10.5)

## 2015-03-27 LAB — POCT URINALYSIS DIPSTICK
Bilirubin, UA: NEGATIVE
Blood, UA: NEGATIVE
Glucose, UA: NEGATIVE
Ketones, UA: NEGATIVE
Leukocytes, UA: NEGATIVE
Nitrite, UA: NEGATIVE
Protein, UA: NEGATIVE
Spec Grav, UA: >=1.03
Urobilinogen, UA: 0.2
pH, UA: 6

## 2015-03-27 LAB — LIPID PANEL
Cholesterol: 267 mg/dL — ABNORMAL HIGH (ref 0–200)
HDL: 103.2 mg/dL (ref >39.00)
LDL Cholesterol: 148 mg/dL — ABNORMAL HIGH (ref 0–99)
NonHDL: 164.02
Total CHOL/HDL Ratio: 3
Triglycerides: 78 mg/dL (ref 0.0–149.0)
VLDL: 15.6 mg/dL (ref 0.0–40.0)

## 2015-03-27 LAB — HEPATITIS C ANTIBODY: HCV Ab: NEGATIVE

## 2015-03-27 NOTE — Progress Notes (Signed)
Pre visit review using our clinic review tool, if applicable. No additional management support is needed unless otherwise documented below in the visit note. 

## 2015-03-27 NOTE — Patient Instructions (Signed)
Preventive Care for Adults, Female A healthy lifestyle and preventive care can promote health and wellness. Preventive health guidelines for women include the following key practices.  A routine yearly physical is a good way to check with your health care provider about your health and preventive screening. It is a chance to share any concerns and updates on your health and to receive a thorough exam.  Visit your dentist for a routine exam and preventive care every 6 months. Brush your teeth twice a day and floss once a day. Good oral hygiene prevents tooth decay and gum disease.  The frequency of eye exams is based on your age, health, family medical history, use of contact lenses, and other factors. Follow your health care provider's recommendations for frequency of eye exams.  Eat a healthy diet. Foods like vegetables, fruits, whole grains, low-fat dairy products, and lean protein foods contain the nutrients you need without too many calories. Decrease your intake of foods high in solid fats, added sugars, and salt. Eat the right amount of calories for you.Get information about a proper diet from your health care provider, if necessary.  Regular physical exercise is one of the most important things you can do for your health. Most adults should get at least 150 minutes of moderate-intensity exercise (any activity that increases your heart rate and causes you to sweat) each week. In addition, most adults need muscle-strengthening exercises on 2 or more days a week.  Maintain a healthy weight. The body mass index (BMI) is a screening tool to identify possible weight problems. It provides an estimate of body fat based on height and weight. Your health care provider can find your BMI and can help you achieve or maintain a healthy weight.For adults 20 years and older:  A BMI below 18.5 is considered underweight.  A BMI of 18.5 to 24.9 is normal.  A BMI of 25 to 29.9 is considered overweight.  A  BMI of 30 and above is considered obese.  Maintain normal blood lipids and cholesterol levels by exercising and minimizing your intake of saturated fat. Eat a balanced diet with plenty of fruit and vegetables. Blood tests for lipids and cholesterol should begin at age 45 and be repeated every 5 years. If your lipid or cholesterol levels are high, you are over 50, or you are at high risk for heart disease, you may need your cholesterol levels checked more frequently.Ongoing high lipid and cholesterol levels should be treated with medicines if diet and exercise are not working.  If you smoke, find out from your health care provider how to quit. If you do not use tobacco, do not start.  Lung cancer screening is recommended for adults aged 45-80 years who are at high risk for developing lung cancer because of a history of smoking. A yearly low-dose CT scan of the lungs is recommended for people who have at least a 30-pack-year history of smoking and are a current smoker or have quit within the past 15 years. A pack year of smoking is smoking an average of 1 pack of cigarettes a day for 1 year (for example: 1 pack a day for 30 years or 2 packs a day for 15 years). Yearly screening should continue until the smoker has stopped smoking for at least 15 years. Yearly screening should be stopped for people who develop a health problem that would prevent them from having lung cancer treatment.  If you are pregnant, do not drink alcohol. If you are  breastfeeding, be very cautious about drinking alcohol. If you are not pregnant and choose to drink alcohol, do not have more than 1 drink per day. One drink is considered to be 12 ounces (355 mL) of beer, 5 ounces (148 mL) of wine, or 1.5 ounces (44 mL) of liquor.  Avoid use of street drugs. Do not share needles with anyone. Ask for help if you need support or instructions about stopping the use of drugs.  High blood pressure causes heart disease and increases the risk  of stroke. Your blood pressure should be checked at least every 1 to 2 years. Ongoing high blood pressure should be treated with medicines if weight loss and exercise do not work.  If you are 55-79 years old, ask your health care provider if you should take aspirin to prevent strokes.  Diabetes screening is done by taking a blood sample to check your blood glucose level after you have not eaten for a certain period of time (fasting). If you are not overweight and you do not have risk factors for diabetes, you should be screened once every 3 years starting at age 45. If you are overweight or obese and you are 40-70 years of age, you should be screened for diabetes every year as part of your cardiovascular risk assessment.  Breast cancer screening is essential preventive care for women. You should practice "breast self-awareness." This means understanding the normal appearance and feel of your breasts and may include breast self-examination. Any changes detected, no matter how small, should be reported to a health care provider. Women in their 20s and 30s should have a clinical breast exam (CBE) by a health care provider as part of a regular health exam every 1 to 3 years. After age 40, women should have a CBE every year. Starting at age 40, women should consider having a mammogram (breast X-ray test) every year. Women who have a family history of breast cancer should talk to their health care provider about genetic screening. Women at a high risk of breast cancer should talk to their health care providers about having an MRI and a mammogram every year.  Breast cancer gene (BRCA)-related cancer risk assessment is recommended for women who have family members with BRCA-related cancers. BRCA-related cancers include breast, ovarian, tubal, and peritoneal cancers. Having family members with these cancers may be associated with an increased risk for harmful changes (mutations) in the breast cancer genes BRCA1 and  BRCA2. Results of the assessment will determine the need for genetic counseling and BRCA1 and BRCA2 testing.  Your health care provider may recommend that you be screened regularly for cancer of the pelvic organs (ovaries, uterus, and vagina). This screening involves a pelvic examination, including checking for microscopic changes to the surface of your cervix (Pap test). You may be encouraged to have this screening done every 3 years, beginning at age 21.  For women ages 30-65, health care providers may recommend pelvic exams and Pap testing every 3 years, or they may recommend the Pap and pelvic exam, combined with testing for human papilloma virus (HPV), every 5 years. Some types of HPV increase your risk of cervical cancer. Testing for HPV may also be done on women of any age with unclear Pap test results.  Other health care providers may not recommend any screening for nonpregnant women who are considered low risk for pelvic cancer and who do not have symptoms. Ask your health care provider if a screening pelvic exam is right for   you.  If you have had past treatment for cervical cancer or a condition that could lead to cancer, you need Pap tests and screening for cancer for at least 20 years after your treatment. If Pap tests have been discontinued, your risk factors (such as having a new sexual partner) need to be reassessed to determine if screening should resume. Some women have medical problems that increase the chance of getting cervical cancer. In these cases, your health care provider may recommend more frequent screening and Pap tests.  Colorectal cancer can be detected and often prevented. Most routine colorectal cancer screening begins at the age of 50 years and continues through age 75 years. However, your health care provider may recommend screening at an earlier age if you have risk factors for colon cancer. On a yearly basis, your health care provider may provide home test kits to check  for hidden blood in the stool. Use of a small camera at the end of a tube, to directly examine the colon (sigmoidoscopy or colonoscopy), can detect the earliest forms of colorectal cancer. Talk to your health care provider about this at age 50, when routine screening begins. Direct exam of the colon should be repeated every 5-10 years through age 75 years, unless early forms of precancerous polyps or small growths are found.  People who are at an increased risk for hepatitis B should be screened for this virus. You are considered at high risk for hepatitis B if:  You were born in a country where hepatitis B occurs often. Talk with your health care provider about which countries are considered high risk.  Your parents were born in a high-risk country and you have not received a shot to protect against hepatitis B (hepatitis B vaccine).  You have HIV or AIDS.  You use needles to inject street drugs.  You live with, or have sex with, someone who has hepatitis B.  You get hemodialysis treatment.  You take certain medicines for conditions like cancer, organ transplantation, and autoimmune conditions.  Hepatitis C blood testing is recommended for all people born from 1945 through 1965 and any individual with known risks for hepatitis C.  Practice safe sex. Use condoms and avoid high-risk sexual practices to reduce the spread of sexually transmitted infections (STIs). STIs include gonorrhea, chlamydia, syphilis, trichomonas, herpes, HPV, and human immunodeficiency virus (HIV). Herpes, HIV, and HPV are viral illnesses that have no cure. They can result in disability, cancer, and death.  You should be screened for sexually transmitted illnesses (STIs) including gonorrhea and chlamydia if:  You are sexually active and are younger than 24 years.  You are older than 24 years and your health care provider tells you that you are at risk for this type of infection.  Your sexual activity has changed  since you were last screened and you are at an increased risk for chlamydia or gonorrhea. Ask your health care provider if you are at risk.  If you are at risk of being infected with HIV, it is recommended that you take a prescription medicine daily to prevent HIV infection. This is called preexposure prophylaxis (PrEP). You are considered at risk if:  You are sexually active and do not regularly use condoms or know the HIV status of your partner(s).  You take drugs by injection.  You are sexually active with a partner who has HIV.  Talk with your health care provider about whether you are at high risk of being infected with HIV. If   you choose to begin PrEP, you should first be tested for HIV. You should then be tested every 3 months for as long as you are taking PrEP.  Osteoporosis is a disease in which the bones lose minerals and strength with aging. This can result in serious bone fractures or breaks. The risk of osteoporosis can be identified using a bone density scan. Women ages 67 years and over and women at risk for fractures or osteoporosis should discuss screening with their health care providers. Ask your health care provider whether you should take a calcium supplement or vitamin D to reduce the rate of osteoporosis.  Menopause can be associated with physical symptoms and risks. Hormone replacement therapy is available to decrease symptoms and risks. You should talk to your health care provider about whether hormone replacement therapy is right for you.  Use sunscreen. Apply sunscreen liberally and repeatedly throughout the day. You should seek shade when your shadow is shorter than you. Protect yourself by wearing long sleeves, pants, a wide-brimmed hat, and sunglasses year round, whenever you are outdoors.  Once a month, do a whole body skin exam, using a mirror to look at the skin on your back. Tell your health care provider of new moles, moles that have irregular borders, moles that  are larger than a pencil eraser, or moles that have changed in shape or color.  Stay current with required vaccines (immunizations).  Influenza vaccine. All adults should be immunized every year.  Tetanus, diphtheria, and acellular pertussis (Td, Tdap) vaccine. Pregnant women should receive 1 dose of Tdap vaccine during each pregnancy. The dose should be obtained regardless of the length of time since the last dose. Immunization is preferred during the 27th-36th week of gestation. An adult who has not previously received Tdap or who does not know her vaccine status should receive 1 dose of Tdap. This initial dose should be followed by tetanus and diphtheria toxoids (Td) booster doses every 10 years. Adults with an unknown or incomplete history of completing a 3-dose immunization series with Td-containing vaccines should begin or complete a primary immunization series including a Tdap dose. Adults should receive a Td booster every 10 years.  Varicella vaccine. An adult without evidence of immunity to varicella should receive 2 doses or a second dose if she has previously received 1 dose. Pregnant females who do not have evidence of immunity should receive the first dose after pregnancy. This first dose should be obtained before leaving the health care facility. The second dose should be obtained 4-8 weeks after the first dose.  Human papillomavirus (HPV) vaccine. Females aged 13-26 years who have not received the vaccine previously should obtain the 3-dose series. The vaccine is not recommended for use in pregnant females. However, pregnancy testing is not needed before receiving a dose. If a female is found to be pregnant after receiving a dose, no treatment is needed. In that case, the remaining doses should be delayed until after the pregnancy. Immunization is recommended for any person with an immunocompromised condition through the age of 61 years if she did not get any or all doses earlier. During the  3-dose series, the second dose should be obtained 4-8 weeks after the first dose. The third dose should be obtained 24 weeks after the first dose and 16 weeks after the second dose.  Zoster vaccine. One dose is recommended for adults aged 30 years or older unless certain conditions are present.  Measles, mumps, and rubella (MMR) vaccine. Adults born  before 1957 generally are considered immune to measles and mumps. Adults born in 1957 or later should have 1 or more doses of MMR vaccine unless there is a contraindication to the vaccine or there is laboratory evidence of immunity to each of the three diseases. A routine second dose of MMR vaccine should be obtained at least 28 days after the first dose for students attending postsecondary schools, health care workers, or international travelers. People who received inactivated measles vaccine or an unknown type of measles vaccine during 1963-1967 should receive 2 doses of MMR vaccine. People who received inactivated mumps vaccine or an unknown type of mumps vaccine before 1979 and are at high risk for mumps infection should consider immunization with 2 doses of MMR vaccine. For females of childbearing age, rubella immunity should be determined. If there is no evidence of immunity, females who are not pregnant should be vaccinated. If there is no evidence of immunity, females who are pregnant should delay immunization until after pregnancy. Unvaccinated health care workers born before 1957 who lack laboratory evidence of measles, mumps, or rubella immunity or laboratory confirmation of disease should consider measles and mumps immunization with 2 doses of MMR vaccine or rubella immunization with 1 dose of MMR vaccine.  Pneumococcal 13-valent conjugate (PCV13) vaccine. When indicated, a person who is uncertain of his immunization history and has no record of immunization should receive the PCV13 vaccine. All adults 65 years of age and older should receive this  vaccine. An adult aged 19 years or older who has certain medical conditions and has not been previously immunized should receive 1 dose of PCV13 vaccine. This PCV13 should be followed with a dose of pneumococcal polysaccharide (PPSV23) vaccine. Adults who are at high risk for pneumococcal disease should obtain the PPSV23 vaccine at least 8 weeks after the dose of PCV13 vaccine. Adults older than 67 years of age who have normal immune system function should obtain the PPSV23 vaccine dose at least 1 year after the dose of PCV13 vaccine.  Pneumococcal polysaccharide (PPSV23) vaccine. When PCV13 is also indicated, PCV13 should be obtained first. All adults aged 65 years and older should be immunized. An adult younger than age 65 years who has certain medical conditions should be immunized. Any person who resides in a nursing home or long-term care facility should be immunized. An adult smoker should be immunized. People with an immunocompromised condition and certain other conditions should receive both PCV13 and PPSV23 vaccines. People with human immunodeficiency virus (HIV) infection should be immunized as soon as possible after diagnosis. Immunization during chemotherapy or radiation therapy should be avoided. Routine use of PPSV23 vaccine is not recommended for American Indians, [place] Natives, or people younger than 65 years unless there are medical conditions that require PPSV23 vaccine. When indicated, people who have unknown immunization and have no record of immunization should receive PPSV23 vaccine. One-time revaccination 5 years after the first dose of PPSV23 is recommended for people aged 19-64 years who have chronic kidney failure, nephrotic syndrome, asplenia, or immunocompromised conditions. People who received 1-2 doses of PPSV23 before age 65 years should receive another dose of PPSV23 vaccine at age 65 years or later if at least 5 years have passed since the previous dose. Doses of PPSV23 are not  needed for people immunized with PPSV23 at or after age 65 years.  Meningococcal vaccine. Adults with asplenia or persistent complement component deficiencies should receive 2 doses of quadrivalent meningococcal conjugate (MenACWY-D) vaccine. The doses should be obtained   at least 2 months apart. Microbiologists working with certain meningococcal bacteria, [place] recruits, people at risk during an outbreak, and people who travel to or live in countries with a high rate of meningitis should be immunized. A first-year college student up through age 34 years who is living in a residence hall should receive a dose if she did not receive a dose on or after her 16th birthday. Adults who have certain high-risk conditions should receive one or more doses of vaccine.  Hepatitis A vaccine. Adults who wish to be protected from this disease, have certain high-risk conditions, work with hepatitis A-infected animals, work in hepatitis A research labs, or travel to or work in countries with a high rate of hepatitis A should be immunized. Adults who were previously unvaccinated and who anticipate close contact with an international adoptee during the first 60 days after arrival in the [place] from a country with a high rate of hepatitis A should be immunized.  Hepatitis B vaccine. Adults who wish to be protected from this disease, have certain high-risk conditions, may be exposed to blood or other infectious body fluids, are household contacts or sex partners of hepatitis B positive people, are clients or workers in certain care facilities, or travel to or work in countries with a high rate of hepatitis B should be immunized.  Haemophilus influenzae type b (Hib) vaccine. A previously unvaccinated person with asplenia or sickle cell disease or having a scheduled splenectomy should receive 1 dose of Hib vaccine. Regardless of previous immunization, a recipient of a hematopoietic stem cell transplant should receive a  3-dose series 6-12 months after her successful transplant. Hib vaccine is not recommended for adults with HIV infection. Preventive Services / Frequency Ages 35 to 4 years  Blood pressure check.** / Every 3-5 years.  Lipid and cholesterol check.** / Every 5 years beginning at age 60.  Clinical breast exam.** / Every 3 years for women in their 71s and 10s.  BRCA-related cancer risk assessment.** / For women who have family members with a BRCA-related cancer (breast, ovarian, tubal, or peritoneal cancers).  Pap test.** / Every 2 years from ages 76 through 26. Every 3 years starting at age 61 through age 76 or 93 with a history of 3 consecutive normal Pap tests.  HPV screening.** / Every 3 years from ages 37 through ages 60 to 51 with a history of 3 consecutive normal Pap tests.  Hepatitis C blood test.** / For any individual with known risks for hepatitis C.  Skin self-exam. / Monthly.  Influenza vaccine. / Every year.  Tetanus, diphtheria, and acellular pertussis (Tdap, Td) vaccine.** / Consult your health care provider. Pregnant women should receive 1 dose of Tdap vaccine during each pregnancy. 1 dose of Td every 10 years.  Varicella vaccine.** / Consult your health care provider. Pregnant females who do not have evidence of immunity should receive the first dose after pregnancy.  HPV vaccine. / 3 doses over 6 months, if 93 and younger. The vaccine is not recommended for use in pregnant females. However, pregnancy testing is not needed before receiving a dose.  Measles, mumps, rubella (MMR) vaccine.** / You need at least 1 dose of MMR if you were born in 1957 or later. You may also need a 2nd dose. For females of childbearing age, rubella immunity should be determined. If there is no evidence of immunity, females who are not pregnant should be vaccinated. If there is no evidence of immunity, females who are  pregnant should delay immunization until after pregnancy.  Pneumococcal  13-valent conjugate (PCV13) vaccine.** / Consult your health care provider.  Pneumococcal polysaccharide (PPSV23) vaccine.** / 1 to 2 doses if you smoke cigarettes or if you have certain conditions.  Meningococcal vaccine.** / 1 dose if you are age 68 to 8 years and a Market researcher living in a residence hall, or have one of several medical conditions, you need to get vaccinated against meningococcal disease. You may also need additional booster doses.  Hepatitis A vaccine.** / Consult your health care provider.  Hepatitis B vaccine.** / Consult your health care provider.  Haemophilus influenzae type b (Hib) vaccine.** / Consult your health care provider. Ages 7 to 53 years  Blood pressure check.** / Every year.  Lipid and cholesterol check.** / Every 5 years beginning at age 25 years.  Lung cancer screening. / Every year if you are aged 11-80 years and have a 30-pack-year history of smoking and currently smoke or have quit within the past 15 years. Yearly screening is stopped once you have quit smoking for at least 15 years or develop a health problem that would prevent you from having lung cancer treatment.  Clinical breast exam.** / Every year after age 48 years.  BRCA-related cancer risk assessment.** / For women who have family members with a BRCA-related cancer (breast, ovarian, tubal, or peritoneal cancers).  Mammogram.** / Every year beginning at age 41 years and continuing for as long as you are in good health. Consult with your health care provider.  Pap test.** / Every 3 years starting at age 65 years through age 37 or 70 years with a history of 3 consecutive normal Pap tests.  HPV screening.** / Every 3 years from ages 72 years through ages 60 to 40 years with a history of 3 consecutive normal Pap tests.  Fecal occult blood test (FOBT) of stool. / Every year beginning at age 21 years and continuing until age 5 years. You may not need to do this test if you get  a colonoscopy every 10 years.  Flexible sigmoidoscopy or colonoscopy.** / Every 5 years for a flexible sigmoidoscopy or every 10 years for a colonoscopy beginning at age 35 years and continuing until age 48 years.  Hepatitis C blood test.** / For all people born from [birth year] through 1965 and any individual with known risks for hepatitis C.  Skin self-exam. / Monthly.  Influenza vaccine. / Every year.  Tetanus, diphtheria, and acellular pertussis (Tdap/Td) vaccine.** / Consult your health care provider. Pregnant women should receive 1 dose of Tdap vaccine during each pregnancy. 1 dose of Td every 10 years.  Varicella vaccine.** / Consult your health care provider. Pregnant females who do not have evidence of immunity should receive the first dose after pregnancy.  Zoster vaccine.** / 1 dose for adults aged 30 years or older.  Measles, mumps, rubella (MMR) vaccine.** / You need at least 1 dose of MMR if you were born in 1957 or later. You may also need a second dose. For females of childbearing age, rubella immunity should be determined. If there is no evidence of immunity, females who are not pregnant should be vaccinated. If there is no evidence of immunity, females who are pregnant should delay immunization until after pregnancy.  Pneumococcal 13-valent conjugate (PCV13) vaccine.** / Consult your health care provider.  Pneumococcal polysaccharide (PPSV23) vaccine.** / 1 to 2 doses if you smoke cigarettes or if you have certain conditions.  Meningococcal vaccine.** /  Consult your health care provider.  Hepatitis A vaccine.** / Consult your health care provider.  Hepatitis B vaccine.** / Consult your health care provider.  Haemophilus influenzae type b (Hib) vaccine.** / Consult your health care provider. Ages 64 years and over  Blood pressure check.** / Every year.  Lipid and cholesterol check.** / Every 5 years beginning at age 23 years.  Lung cancer screening. / Every year if you  are aged 16-80 years and have a 30-pack-year history of smoking and currently smoke or have quit within the past 15 years. Yearly screening is stopped once you have quit smoking for at least 15 years or develop a health problem that would prevent you from having lung cancer treatment.  Clinical breast exam.** / Every year after age 74 years.  BRCA-related cancer risk assessment.** / For women who have family members with a BRCA-related cancer (breast, ovarian, tubal, or peritoneal cancers).  Mammogram.** / Every year beginning at age 44 years and continuing for as long as you are in good health. Consult with your health care provider.  Pap test.** / Every 3 years starting at age 58 years through age 22 or 39 years with 3 consecutive normal Pap tests. Testing can be stopped between 65 and 70 years with 3 consecutive normal Pap tests and no abnormal Pap or HPV tests in the past 10 years.  HPV screening.** / Every 3 years from ages 64 years through ages 70 or 61 years with a history of 3 consecutive normal Pap tests. Testing can be stopped between 65 and 70 years with 3 consecutive normal Pap tests and no abnormal Pap or HPV tests in the past 10 years.  Fecal occult blood test (FOBT) of stool. / Every year beginning at age 40 years and continuing until age 27 years. You may not need to do this test if you get a colonoscopy every 10 years.  Flexible sigmoidoscopy or colonoscopy.** / Every 5 years for a flexible sigmoidoscopy or every 10 years for a colonoscopy beginning at age 7 years and continuing until age 32 years.  Hepatitis C blood test.** / For all people born from [birth year] through 1965 and any individual with known risks for hepatitis C.  Osteoporosis screening.** / A one-time screening for women ages 30 years and over and women at risk for fractures or osteoporosis.  Skin self-exam. / Monthly.  Influenza vaccine. / Every year.  Tetanus, diphtheria, and acellular pertussis (Tdap/Td)  vaccine.** / 1 dose of Td every 10 years.  Varicella vaccine.** / Consult your health care provider.  Zoster vaccine.** / 1 dose for adults aged 35 years or older.  Pneumococcal 13-valent conjugate (PCV13) vaccine.** / Consult your health care provider.  Pneumococcal polysaccharide (PPSV23) vaccine.** / 1 dose for all adults aged 46 years and older.  Meningococcal vaccine.** / Consult your health care provider.  Hepatitis A vaccine.** / Consult your health care provider.  Hepatitis B vaccine.** / Consult your health care provider.  Haemophilus influenzae type b (Hib) vaccine.** / Consult your health care provider. ** Family history and personal history of risk and conditions may change your health care provider's recommendations.   This information is not intended to replace advice given to you by your health care provider. Make sure you discuss any questions you have with your health care provider.   Document Released: 04/22/2001 Document Revised: 03/17/2014 Document Reviewed: 07/22/2010 Elsevier Interactive Patient Education [employer].

## 2015-03-27 NOTE — Progress Notes (Signed)
Subjective:   Margaret Foster is a 65 y.o. female who presents for Medicare Annual (Subsequent) preventive examination.  Review of Systems:   Review of Systems  Constitutional: Negative for activity change, appetite change and fatigue.  HENT: Negative for hearing loss, congestion, tinnitus and ear discharge.   Eyes: Negative for visual disturbance (see optho q1y -- vision corrected to 20/20 with glasses).  Respiratory: Negative for cough, chest tightness and shortness of breath.   Cardiovascular: Negative for chest pain, palpitations and leg swelling.  Gastrointestinal: Negative for abdominal pain, diarrhea, constipation and abdominal distention.  Genitourinary: Negative for urgency, frequency, decreased urine volume and difficulty urinating.  Musculoskeletal: Negative for back pain, arthralgias and gait problem.  Skin: Negative for color change, pallor and rash.  Neurological: Negative for dizziness, light-headedness, numbness and headaches.  Hematological: Negative for adenopathy. Does not bruise/bleed easily.  Psychiatric/Behavioral: Negative for suicidal ideas, confusion, sleep disturbance, self-injury, dysphoric mood, decreased concentration and agitation.  Pt is able to read and write and can do all ADLs No risk for falling No abuse/ violence in home          Objective:     Vitals: BP 132/70 mmHg  Pulse 64  Temp(Src) 98.5 F (36.9 C) (Oral)  Ht 5\' 2"  (1.575 m)  Wt 161 lb 12.8 oz (73.392 kg)  BMI 29.59 kg/m2  SpO2 98% BP 132/70 mmHg  Pulse 64  Temp(Src) 98.5 F (36.9 C) (Oral)  Ht 5\' 2"  (1.575 m)  Wt 161 lb 12.8 oz (73.392 kg)  BMI 29.59 kg/m2  SpO2 98% General appearance: alert, cooperative, appears stated age and no distress Head: Normocephalic, without obvious abnormality, atraumatic Eyes: conjunctivae/corneas clear. PERRL, EOM's intact. Fundi benign. Ears: normal TM's and external ear canals both ears Nose: Nares normal. Septum midline. Mucosa normal. No  drainage or sinus tenderness. Throat: lips, mucosa, and tongue normal; teeth and gums normal Neck: no adenopathy, no carotid bruit, no JVD, supple, symmetrical, trachea midline and thyroid not enlarged, symmetric, no tenderness/mass/nodules Back: symmetric, no curvature. ROM normal. No CVA tenderness. Lungs: clear to auscultation bilaterally Breasts: normal appearance, no masses or tenderness Heart: regular rate and rhythm, S1, S2 normal, no murmur, click, rub or gallop Abdomen: soft, non-tender; bowel sounds normal; no masses,  no organomegaly Pelvic: deferred Extremities: extremities normal, atraumatic, no cyanosis or edema Pulses: 2+ and symmetric Skin: Skin color, texture, turgor normal. No rashes or lesions Lymph nodes: Cervical, supraclavicular, and axillary nodes normal. Neurologic: Alert and oriented X 3, normal strength and tone. Normal symmetric reflexes. Normal coordination and gait Psych- no depression, no anxiety  Tobacco History  Smoking status  . Never Smoker   Smokeless tobacco  . Never Used     Counseling given: Not Answered   Past Medical History  Diagnosis Date  . Chicken pox   . GERD (gastroesophageal reflux disease)   . Skin cancer, basal cell     Face, Shoulder, Back  . Skin cancer (melanoma) ([place])     Arm   Past Surgical History  Procedure Laterality Date  . Skin cancer excision      x's 6 (Multiple sites)  . Abdominal hysterectomy      Still has Ovaries   Family History  Problem Relation Age of Onset  . Arthritis Mother   . Alzheimer's disease Mother   . Kidney disease Mother   . Diabetes Brother    History  Sexual Activity  . Sexual Activity:  . Partners: Male    Outpatient  Encounter Prescriptions as of 03/27/2015  Medication Sig  . aspirin 325 MG tablet Take 325 mg by mouth daily.  . Multiple Vitamin (MULTIVITAMIN) capsule Take 1 capsule by mouth daily.  . [DISCONTINUED] Efinaconazole (JUBLIA) 10 % SOLN Apply once daily for 48 weeks    . [DISCONTINUED] zoster vaccine live, PF, (ZOSTAVAX) 60454 UNT/0.65ML injection Inject 19,400 Units into the skin once.   No facility-administered encounter medications on file as of 03/27/2015.    Activities of Daily Living In your present state of health, do you have any difficulty performing the following activities: 03/27/2015 03/27/2015  Hearing? N N  Vision? N N  Difficulty concentrating or making decisions? N N  Walking or climbing stairs? N N  Dressing or bathing? N N  Doing errands, shopping? N N    Patient Care Team: Rosalita Chessman, DO as PCP - General (Family Medicine) Donavan Burnet, MD (Dermatology)    Assessment:    CPE Exercise Activities and Dietary recommendations-=-- con't 4x a week    Goals    None     Fall Risk Fall Risk  03/27/2015 03/23/2014  Falls in the past year? Yes No  Number falls in past yr: 1 -  Injury with Fall? No -   Depression Screen PHQ 2/9 Scores 03/27/2015 03/23/2014  PHQ - 2 Score 0 1     Cognitive Testing MMSE 30/30  Immunization History  Administered Date(s) Administered  . Tdap 07/03/2011   Screening Tests Health Maintenance  Topic Date Due  . INFLUENZA VACCINE  03/26/2016 (Originally 10/09/2014)  . COLONOSCOPY  03/26/2016 (Originally 02/15/1999)  . ZOSTAVAX  03/26/2016 (Originally 02/14/2009)  . PNA vac Low Risk Adult (1 of 2 - PCV13) 03/26/2016 (Originally 02/14/2014)  . MAMMOGRAM  04/19/2016  . TETANUS/TDAP  07/02/2021  . DEXA SCAN  Completed  . Hepatitis C Screening  Completed      Plan:   see AVS During the course of the visit the patient was educated and counseled about the following appropriate screening and preventive services:   Vaccines to include Pneumoccal, Influenza, Hepatitis B, Td, Zostavax, HCV  Electrocardiogram  Cardiovascular Disease  Colorectal cancer screening  Bone density screening  Diabetes screening  Glaucoma screening  Mammography/PAP  Nutrition counseling   Patient Instructions  (the written plan) was given to the patient.  1. Vitamin D deficiency  - Vitamin D (25 hydroxy)  Garnet Koyanagi, DO  03/28/2015

## 2015-04-04 ENCOUNTER — Telehealth: Payer: Self-pay | Admitting: Family Medicine

## 2015-04-04 MED ORDER — ERGOCALCIFEROL 1.25 MG (50000 UT) PO CAPS: 50000.0000 [IU] | ORAL_CAPSULE | ORAL | Status: DC

## 2015-04-04 NOTE — Telephone Encounter (Signed)
error:[id number] ° °

## 2015-04-04 NOTE — Addendum Note (Signed)
Addended by: Ricky Ala on: 04/04/2015 02:47 PM   Modules accepted: Orders

## 2015-04-05 ENCOUNTER — Other Ambulatory Visit: Payer: Self-pay | Admitting: Family Medicine

## 2015-04-05 DIAGNOSIS — Z1231 Encounter for screening mammogram for malignant neoplasm of breast: Secondary | ICD-10-CM

## 2015-04-05 DIAGNOSIS — Z9289 Personal history of other medical treatment: Secondary | ICD-10-CM

## 2015-04-25 ENCOUNTER — Ambulatory Visit (INDEPENDENT_AMBULATORY_CARE_PROVIDER_SITE_OTHER): Payer: Medicare Other

## 2015-04-25 DIAGNOSIS — Z1231 Encounter for screening mammogram for malignant neoplasm of breast: Secondary | ICD-10-CM | POA: Diagnosis not present

## 2015-05-08 ENCOUNTER — Telehealth: Payer: Self-pay | Admitting: Family Medicine

## 2015-05-08 NOTE — Telephone Encounter (Signed)
Caller name: Self  Can be reached: [phone]   Reason for call: Patient has questions about Vit D that she is taking and would also like to have her lab results from her CPE mailed to her

## 2015-05-08 NOTE — Telephone Encounter (Signed)
Message left to call the office.    KP 

## 2015-05-09 NOTE — Telephone Encounter (Signed)
Caller name: Self Can be reached: [phone]  Pt returned call. Advised you were out of office for the afternoon and will call tomorrow 05/10/15

## 2015-05-09 NOTE — Telephone Encounter (Signed)
Called patient back.  Reviewed instructions on how to take Vitamin D.  She stated understanding.   A copy of labs printed and mailed as requested.

## 2015-06-14 ENCOUNTER — Ambulatory Visit (INDEPENDENT_AMBULATORY_CARE_PROVIDER_SITE_OTHER): Payer: Medicare Other | Admitting: Family Medicine

## 2015-06-14 ENCOUNTER — Encounter: Payer: Self-pay | Admitting: Family Medicine

## 2015-06-14 ENCOUNTER — Telehealth: Payer: Self-pay | Admitting: Family Medicine

## 2015-06-14 VITALS — BP 124/76 | HR 78 | Temp 98.4°F | Ht 62.0 in | Wt 162.2 lb

## 2015-06-14 DIAGNOSIS — R05 Cough: Secondary | ICD-10-CM

## 2015-06-14 DIAGNOSIS — R059 Cough, unspecified: Secondary | ICD-10-CM

## 2015-06-14 MED ORDER — GUAIFENESIN-CODEINE 100-10 MG/5ML PO SYRP: 10.0000 mL | ORAL_SOLUTION | Freq: Three times a day (TID) | ORAL | Status: DC | PRN

## 2015-06-14 MED ORDER — AZITHROMYCIN 250 MG PO TABS: ORAL_TABLET | ORAL | Status: DC

## 2015-06-14 MED ORDER — HYDROCODONE-HOMATROPINE 5-1.5 MG/5ML PO SYRP: 5.0000 mL | ORAL_SOLUTION | Freq: Three times a day (TID) | ORAL | Status: DC | PRN

## 2015-06-14 NOTE — Progress Notes (Signed)
[facility] [address], [place], [place] [address] [phone]  Date:  06/14/2015   Name:  Margaret Foster   DOB:  02-05-1951   MRN:  [id number]  PCP:  Ann Held, DO    Chief Complaint: Cough   History of Present Illness:  Margaret Foster is a 65 y.o. very pleasant female patient who presents with the following:  Generally healthy lady here today for a sick visit. She has noticed illness- noted a cough 4 days ago, did not seem that severe.  She was able to do all her activities.  However she got worse yesterday, did not sleep well last night.  Her daughter is having an operation next week No fever noted She does not have any sinus congestion. Able to breathe through her nose She has used mucinex, tyelnol so she does feel a bit better The cough is generally dry She has not noted any wheezing, no GI symptoms  She takes vitamin but no other medications  Patient Active Problem List   Diagnosis Date Noted  . Pelvic prolapse 03/23/2014    Past Medical History  Diagnosis Date  . Chicken pox   . GERD (gastroesophageal reflux disease)   . Skin cancer, basal cell     Face, Shoulder, Back  . Skin cancer (melanoma) ([place])     Arm    Past Surgical History  Procedure Laterality Date  . Skin cancer excision      x's 6 (Multiple sites)  . Abdominal hysterectomy      Still has Ovaries    Social History  Substance Use Topics  . Smoking status: Never Smoker   . Smokeless tobacco: Never Used  . Alcohol Use: 0.0 oz/week    0 Standard drinks or equivalent per week     Comment: Socially    Family History  Problem Relation Age of Onset  . Arthritis Mother   . Alzheimer's disease Mother   . Kidney disease Mother   . Diabetes Brother     No Known Allergies  Medication list has been reviewed and updated.  Current Outpatient Prescriptions on File Prior to Visit  Medication Sig Dispense Refill  . aspirin  325 MG tablet Take 325 mg by mouth daily.    . ergocalciferol (VITAMIN D2) 50000 units capsule Take 1 capsule (50,000 Units total) by mouth once a week. 4 capsule 2  . Multiple Vitamin (MULTIVITAMIN) capsule Take 1 capsule by mouth daily.     No current facility-administered medications on file prior to visit.    Review of Systems:  As per HPI- otherwise negative.   Physical Examination: Filed Vitals:   06/14/15 1356  BP: 124/76  Pulse: 78  Temp: 98.4 F (36.9 C)   Filed Vitals:   06/14/15 1356  Height: 5\' 2"  (1.575 m)  Weight: 162 lb 3.2 oz (73.573 kg)   Body mass index is 29.66 kg/(m^2). Ideal Body Weight: Weight in (lb) to have BMI = 25: 136.4  GEN: WDWN, NAD, Non-toxic, A & O x 3, looks well HEENT: Atraumatic, Normocephalic. Neck supple. No masses, No LAD. Bilateral TM wnl, oropharynx normal.  PEERL,EOMI.   Ears and Nose: No external deformity. CV: RRR, No M/G/R. No JVD. No thrill. No extra heart sounds. PULM: CTA B, no wheezes, crackles, rhonchi. No retractions. No resp. distress. No accessory muscle use. EXTR: No c/c/e NEURO Normal gait.  PSYCH: Normally interactive. Conversant. Not depressed or  anxious appearing.  Calm demeanor.    Assessment and Plan: Cough - Plan: HYDROcodone-homatropine (HYCODAN) 5-1.5 MG/5ML syrup, azithromycin (ZITHROMAX) 250 MG tablet  Here today with a likely viral cough. She is concerned because her daughter is having a double mastectomy next week and she wants to be well so she can be helpful She will use some hycodan as needed for cough, and given an rx for azithromycin to hold and use if she is not feeling better in the next couple of days  Signed Lamar Blinks, MD

## 2015-06-14 NOTE — Progress Notes (Signed)
Pre visit review using our clinic review tool, if applicable. No additional management support is needed unless otherwise documented below in the visit note. 

## 2015-06-14 NOTE — Telephone Encounter (Signed)
Pt called in because she says that the provider prescribed HYDROcodone. Pt says that she has been to 3 different pharmacies and they are all out of this medication. Pt would like to know if provider could call her in something else?    Pharmacy: [facility], [address]

## 2015-06-14 NOTE — Patient Instructions (Signed)
You most likely have a viral illness. Use the cough syrup as needed but remember it will make you drowsy. Ok to use with OTC medications as needed If you are not improving in the next couple of days you can fill and use the antibiotic if needed Let me know if you have any concerns!

## 2015-06-14 NOTE — Telephone Encounter (Signed)
rx for cheratussin instead, called in to [facility] pt and let her know  Meds ordered this encounter  Medications  . guaiFENesin-codeine (CHERATUSSIN AC) 100-10 MG/5ML syrup    Sig: Take 10 mLs by mouth 3 (three) times daily as needed for cough.    Dispense:  120 mL    Refill:  0

## 2015-07-05 DIAGNOSIS — D229 Melanocytic nevi, unspecified: Secondary | ICD-10-CM | POA: Diagnosis not present

## 2015-07-05 DIAGNOSIS — Z85828 Personal history of other malignant neoplasm of skin: Secondary | ICD-10-CM | POA: Diagnosis not present

## 2015-07-05 DIAGNOSIS — L821 Other seborrheic keratosis: Secondary | ICD-10-CM | POA: Diagnosis not present

## 2015-07-05 DIAGNOSIS — L905 Scar conditions and fibrosis of skin: Secondary | ICD-10-CM | POA: Diagnosis not present

## 2015-08-03 ENCOUNTER — Telehealth: Payer: Self-pay | Admitting: Family Medicine

## 2015-08-03 DIAGNOSIS — E785 Hyperlipidemia, unspecified: Secondary | ICD-10-CM

## 2015-08-03 DIAGNOSIS — E559 Vitamin D deficiency, unspecified: Secondary | ICD-10-CM

## 2015-08-03 NOTE — Telephone Encounter (Signed)
Pt says that PCP had her taking Vitamin D . She says that she was told to have labs once completing a Rx. Pt would like to be sure of if she is to come in to have labs now or should she continue taking vitamin D.    CB:  [phone]

## 2015-08-03 NOTE — Telephone Encounter (Signed)
The orders are in. Please scheduled a lab visit.     KP

## 2015-08-09 NOTE — Telephone Encounter (Signed)
Please call patient to schedule as note requested

## 2015-08-09 NOTE — Telephone Encounter (Signed)
Left message for patient to call the office to schedule lab.

## 2015-08-29 ENCOUNTER — Other Ambulatory Visit (INDEPENDENT_AMBULATORY_CARE_PROVIDER_SITE_OTHER): Payer: Medicare Other

## 2015-08-29 DIAGNOSIS — E559 Vitamin D deficiency, unspecified: Secondary | ICD-10-CM

## 2015-08-29 DIAGNOSIS — E785 Hyperlipidemia, unspecified: Secondary | ICD-10-CM | POA: Diagnosis not present

## 2015-08-29 LAB — COMPREHENSIVE METABOLIC PANEL
ALT: 20 U/L (ref 0–35)
AST: 20 U/L (ref 0–37)
Albumin: 4.1 g/dL (ref 3.5–5.2)
Alkaline Phosphatase: 60 U/L (ref 39–117)
BUN: 11 mg/dL (ref 6–23)
CO2: 29 mEq/L (ref 19–32)
Calcium: 9.1 mg/dL (ref 8.4–10.5)
Chloride: 105 mEq/L (ref 96–112)
Creatinine, Ser: 0.71 mg/dL (ref 0.40–1.20)
GFR: 87.4 mL/min (ref >60.00)
Glucose, Bld: 97 mg/dL (ref 70–99)
Potassium: 3.9 mEq/L (ref 3.5–5.1)
Sodium: 139 mEq/L (ref 135–145)
Total Bilirubin: 0.5 mg/dL (ref 0.2–1.2)
Total Protein: 6.9 g/dL (ref 6.0–8.3)

## 2015-08-29 LAB — LIPID PANEL
Cholesterol: 251 mg/dL — ABNORMAL HIGH (ref 0–200)
HDL: 93.6 mg/dL (ref >39.00)
LDL Cholesterol: 140 mg/dL — ABNORMAL HIGH (ref 0–99)
NonHDL: 157.25
Total CHOL/HDL Ratio: 3
Triglycerides: 86 mg/dL (ref 0.0–149.0)
VLDL: 17.2 mg/dL (ref 0.0–40.0)

## 2015-08-29 LAB — VITAMIN D 25 HYDROXY (VIT D DEFICIENCY, FRACTURES): VITD: 37.26 ng/mL (ref 30.00–100.00)

## 2016-01-10 DIAGNOSIS — D229 Melanocytic nevi, unspecified: Secondary | ICD-10-CM | POA: Diagnosis not present

## 2016-01-10 DIAGNOSIS — L821 Other seborrheic keratosis: Secondary | ICD-10-CM | POA: Diagnosis not present

## 2016-01-10 DIAGNOSIS — Z85828 Personal history of other malignant neoplasm of skin: Secondary | ICD-10-CM | POA: Diagnosis not present

## 2016-01-10 DIAGNOSIS — L905 Scar conditions and fibrosis of skin: Secondary | ICD-10-CM | POA: Diagnosis not present

## 2016-03-31 ENCOUNTER — Ambulatory Visit: Payer: Medicare Other | Admitting: Family Medicine

## 2016-06-17 ENCOUNTER — Ambulatory Visit (INDEPENDENT_AMBULATORY_CARE_PROVIDER_SITE_OTHER): Payer: Medicare Other | Admitting: Family Medicine

## 2016-06-17 ENCOUNTER — Encounter: Payer: Self-pay | Admitting: Family Medicine

## 2016-06-17 VITALS — BP 122/64 | HR 67 | Temp 98.2°F | Resp 16 | Ht 62.0 in | Wt 142.8 lb

## 2016-06-17 DIAGNOSIS — Z0000 Encounter for general adult medical examination without abnormal findings: Secondary | ICD-10-CM | POA: Diagnosis not present

## 2016-06-17 DIAGNOSIS — E782 Mixed hyperlipidemia: Secondary | ICD-10-CM

## 2016-06-17 DIAGNOSIS — Z1231 Encounter for screening mammogram for malignant neoplasm of breast: Secondary | ICD-10-CM | POA: Diagnosis not present

## 2016-06-17 DIAGNOSIS — E2839 Other primary ovarian failure: Secondary | ICD-10-CM

## 2016-06-17 DIAGNOSIS — Z1239 Encounter for other screening for malignant neoplasm of breast: Secondary | ICD-10-CM

## 2016-06-17 DIAGNOSIS — Z8639 Personal history of other endocrine, nutritional and metabolic disease: Secondary | ICD-10-CM | POA: Diagnosis not present

## 2016-06-17 DIAGNOSIS — R42 Dizziness and giddiness: Secondary | ICD-10-CM | POA: Diagnosis not present

## 2016-06-17 DIAGNOSIS — E559 Vitamin D deficiency, unspecified: Secondary | ICD-10-CM | POA: Diagnosis not present

## 2016-06-17 LAB — LIPID PANEL
Cholesterol: 266 mg/dL — ABNORMAL HIGH (ref 0–200)
HDL: 96 mg/dL (ref >39.00)
LDL Cholesterol: 154 mg/dL — ABNORMAL HIGH (ref 0–99)
NonHDL: 169.53
Total CHOL/HDL Ratio: 3
Triglycerides: 79 mg/dL (ref 0.0–149.0)
VLDL: 15.8 mg/dL (ref 0.0–40.0)

## 2016-06-17 LAB — COMPREHENSIVE METABOLIC PANEL
ALT: 21 U/L (ref 0–35)
AST: 27 U/L (ref 0–37)
Albumin: 4.3 g/dL (ref 3.5–5.2)
Alkaline Phosphatase: 64 U/L (ref 39–117)
BUN: 13 mg/dL (ref 6–23)
CO2: 29 mEq/L (ref 19–32)
Calcium: 9.3 mg/dL (ref 8.4–10.5)
Chloride: 103 mEq/L (ref 96–112)
Creatinine, Ser: 0.81 mg/dL (ref 0.40–1.20)
GFR: 74.88 mL/min (ref >60.00)
Glucose, Bld: 99 mg/dL (ref 70–99)
Potassium: 4 mEq/L (ref 3.5–5.1)
Sodium: 138 mEq/L (ref 135–145)
Total Bilirubin: 0.6 mg/dL (ref 0.2–1.2)
Total Protein: 7.4 g/dL (ref 6.0–8.3)

## 2016-06-17 LAB — CBC
HCT: 41.6 % (ref 36.0–46.0)
Hemoglobin: 13.9 g/dL (ref 12.0–15.0)
MCHC: 33.5 g/dL (ref 30.0–36.0)
MCV: 98 fl (ref 78.0–100.0)
Platelets: 280 10*3/uL (ref 150.0–400.0)
RBC: 4.24 Mil/uL (ref 3.87–5.11)
RDW: 13.6 % (ref 11.5–15.5)
WBC: 5.1 10*3/uL (ref 4.0–10.5)

## 2016-06-17 LAB — VITAMIN D 25 HYDROXY (VIT D DEFICIENCY, FRACTURES): VITD: 44.2 ng/mL (ref 30.00–100.00)

## 2016-06-17 MED ORDER — MECLIZINE HCL 25 MG PO TABS: 25.0000 mg | ORAL_TABLET | Freq: Three times a day (TID) | ORAL | 0 refills | Status: DC | PRN

## 2016-06-17 NOTE — Progress Notes (Deleted)
Subjective:  I acted as a Education administrator for Dr. Royden Purl, LPN    Patient ID: Margaret Foster, female    DOB: 04/14/1950, 66 y.o.   MRN: [id number]  No chief complaint on file.   HPI  Patient is in today for medicare wellness.  Patient Care Team: Ann Held, DO as PCP - General (Family Medicine) Donavan Burnet, MD (Dermatology)   Past Medical History:  Diagnosis Date  . Chicken pox   . GERD (gastroesophageal reflux disease)   . Skin cancer (melanoma) ([facility])    Arm  . Skin cancer, basal cell    Face, Shoulder, Back    Past Surgical History:  Procedure Laterality Date  . ABDOMINAL HYSTERECTOMY     Still has Ovaries  . SKIN CANCER EXCISION     x's 6 (Multiple sites)    Family History  Problem Relation Age of Onset  . Arthritis Mother   . Alzheimer's disease Mother   . Kidney disease Mother   . Diabetes Brother     Social History   Social History  . Marital status: Married    Spouse name: N/A  . Number of children: N/A  . Years of education: N/A   Occupational History  .  [employer]    retired   Social History Main Topics  . Smoking status: Never Smoker  . Smokeless tobacco: Never Used  . Alcohol use 0.0 oz/week     Comment: Socially  . Drug use: No  . Sexual activity: Yes    Partners: Male   Other Topics Concern  . Not on file   Social History Narrative   Exercise 4x a week    Outpatient Medications Prior to Visit  Medication Sig Dispense Refill  . aspirin 325 MG tablet Take 325 mg by mouth daily.    Marland Kitchen azithromycin (ZITHROMAX) 250 MG tablet Use as a zpack 6 tablet 0  . Cholecalciferol (VITAMIN D-3) 1000 units CAPS Take 2 Units by mouth.    . ergocalciferol (VITAMIN D2) 50000 units capsule Take 1 capsule (50,000 Units total) by mouth once a week. 4 capsule 2  . guaiFENesin-codeine (CHERATUSSIN AC) 100-10 MG/5ML syrup Take 10 mLs by mouth 3 (three) times daily as needed for cough. 120 mL 0  . Multiple Vitamin (MULTIVITAMIN) capsule Take  1 capsule by mouth daily.     No facility-administered medications prior to visit.     No Known Allergies  Review of Systems  Constitutional: Negative for fever.  HENT: Negative for congestion.   Eyes: Negative for blurred vision.  Respiratory: Negative for cough.   Cardiovascular: Negative for chest pain and palpitations.  Gastrointestinal: Negative for vomiting.  Musculoskeletal: Negative for back pain.  Skin: Negative for rash.  Neurological: Negative for loss of consciousness and headaches.       Objective:    Physical Exam  Constitutional: She is oriented to person, place, and time. She appears well-developed and well-nourished. No distress.  HENT:  Head: Normocephalic and atraumatic.  Eyes: Conjunctivae are normal. Pupils are equal, round, and reactive to light.  Neck: Normal range of motion. No thyromegaly present.  Cardiovascular: Normal rate and regular rhythm.   Pulmonary/Chest: Effort normal and breath sounds normal. She has no wheezes.  Abdominal: Soft. Bowel sounds are normal. There is no tenderness.  Musculoskeletal: Normal range of motion. She exhibits no edema or deformity.  Neurological: She is alert and oriented to person, place, and time.  Skin: Skin is warm and dry.  She is not diaphoretic.  Psychiatric: She has a normal mood and affect.    There were no vitals taken for this visit. Wt Readings from Last 3 Encounters:  06/14/15 162 lb 3.2 oz (73.6 kg)  03/27/15 161 lb 12.8 oz (73.4 kg)  03/23/14 156 lb (70.8 kg)   BP Readings from Last 3 Encounters:  06/14/15 124/76  03/27/15 132/70  03/23/14 132/76     Immunization History  Administered Date(s) Administered  . Tdap 07/03/2011    Health Maintenance  Topic Date Due  . COLONOSCOPY  02/15/1999  . PNA vac Low Risk Adult (1 of 2 - PCV13) 02/14/2014  . INFLUENZA VACCINE  10/08/2016  . MAMMOGRAM  04/24/2017  . TETANUS/TDAP  07/02/2021  . DEXA SCAN  Completed  . Hepatitis C Screening  Completed     Lab Results  Component Value Date   WBC 4.5 03/27/2015   HGB 14.3 03/27/2015   HCT 43.2 03/27/2015   PLT 292.0 03/27/2015   GLUCOSE 97 08/29/2015   CHOL 251 (H) 08/29/2015   TRIG 86.0 08/29/2015   HDL 93.60 08/29/2015   LDLCALC 140 (H) 08/29/2015   ALT 20 08/29/2015   AST 20 08/29/2015   NA 139 08/29/2015   K 3.9 08/29/2015   CL 105 08/29/2015   CREATININE 0.71 08/29/2015   BUN 11 08/29/2015   CO2 29 08/29/2015    No results found for: TSH Lab Results  Component Value Date   WBC 4.5 03/27/2015   HGB 14.3 03/27/2015   HCT 43.2 03/27/2015   MCV 97.5 03/27/2015   PLT 292.0 03/27/2015   Lab Results  Component Value Date   NA 139 08/29/2015   K 3.9 08/29/2015   CO2 29 08/29/2015   GLUCOSE 97 08/29/2015   BUN 11 08/29/2015   CREATININE 0.71 08/29/2015   BILITOT 0.5 08/29/2015   ALKPHOS 60 08/29/2015   AST 20 08/29/2015   ALT 20 08/29/2015   PROT 6.9 08/29/2015   ALBUMIN 4.1 08/29/2015   CALCIUM 9.1 08/29/2015   GFR 87.40 08/29/2015   Lab Results  Component Value Date   CHOL 251 (H) 08/29/2015   Lab Results  Component Value Date   HDL 93.60 08/29/2015   Lab Results  Component Value Date   LDLCALC 140 (H) 08/29/2015   Lab Results  Component Value Date   TRIG 86.0 08/29/2015   Lab Results  Component Value Date   CHOLHDL 3 08/29/2015   No results found for: HGBA1C       Assessment & Plan:   Problem List Items Addressed This Visit    None      I am having Ms. Furuya maintain her aspirin, multivitamin, ergocalciferol, Vitamin D-3, azithromycin, and guaiFENesin-codeine.  No orders of the defined types were placed in this encounter.   {PROVIDER TO DELETE} Rip Harbour, LPN  Patient ID: Margaret Foster, female   DOB: Oct 17, 1950, 66 y.o.   MRN: [id number]

## 2016-06-17 NOTE — Progress Notes (Deleted)
Pre visit review using our clinic review tool, if applicable. No additional management support is needed unless otherwise documented below in the visit note. 

## 2016-06-17 NOTE — Progress Notes (Signed)
Subjective:   Margaret Foster is a 66 y.o. female who presents for Medicare Annual (Subsequent) preventive examination. Pt here for vertigo x years but this past year she has had it 3x in past year.  Pt has to lay flat on her back and then she is ok in about 3 days -- she is asking for meds.  No dizziness today.    Review of Systems:     Review of Systems  Constitutional: Negative for activity change, appetite change and fatigue.  HENT: Negative for hearing loss, congestion, tinnitus and ear discharge.   Eyes: Negative for visual disturbance (see optho q1y -- vision corrected to 20/20 with glasses).  Respiratory: Negative for cough, chest tightness and shortness of breath.   Cardiovascular: Negative for chest pain, palpitations and leg swelling.  Gastrointestinal: Negative for abdominal pain, diarrhea, constipation and abdominal distention.  Genitourinary: Negative for urgency, frequency, decreased urine volume and difficulty urinating.  Musculoskeletal: Negative for back pain, arthralgias and gait problem.  Skin: Negative for color change, pallor and rash.  Neurological: Negative for dizziness, light-headedness, numbness and headaches.  Hematological: Negative for adenopathy. Does not bruise/bleed easily.  Psychiatric/Behavioral: Negative for suicidal ideas, confusion, sleep disturbance, self-injury, dysphoric mood, decreased concentration and agitation.  Pt is able to read and write and can do all ADLs No risk for falling No abuse/ violence in home       Objective:     Vitals: BP 122/64 (BP Location: Left Arm, Patient Position: Sitting, Cuff Size: Normal)   Pulse 67   Temp 98.2 F (36.8 C) (Oral)   Resp 16   Ht 5\' 2"  (1.575 m)   Wt 142 lb 12.8 oz (64.8 kg)   SpO2 97%   BMI 26.12 kg/m   Body mass index is 26.12 kg/m. BP 122/64 (BP Location: Left Arm, Patient Position: Sitting, Cuff Size: Normal)   Pulse 67   Temp 98.2 F (36.8 C) (Oral)   Resp 16   Ht 5\' 2"  (1.575 m)    Wt 142 lb 12.8 oz (64.8 kg)   SpO2 97%   BMI 26.12 kg/m  General appearance: alert, cooperative, appears stated age and no distress Eyes: conjunctivae/corneas clear. PERRL, EOM's intact. Fundi benign. Ears: normal TM's and external ear canals both ears Nose: Nares normal. Septum midline. Mucosa normal. No drainage or sinus tenderness. Throat: lips, mucosa, and tongue normal; teeth and gums normal Neck: no adenopathy, no carotid bruit, no JVD, supple, symmetrical, trachea midline and thyroid not enlarged, symmetric, no tenderness/mass/nodules Lungs: clear to auscultation bilaterally Heart: regular rate and rhythm, S1, S2 normal, no murmur, click, rub or gallop Neurologic: Alert and oriented X 3, normal strength and tone. Normal symmetric reflexes. Normal coordination and gait  Tobacco History  Smoking Status  . Never Smoker  Smokeless Tobacco  . Never Used     Counseling given: Not Answered   Past Medical History:  Diagnosis Date  . Chicken pox   . GERD (gastroesophageal reflux disease)   . Skin cancer (melanoma) (HCC)    Arm  . Skin cancer, basal cell    Face, Shoulder, Back   Past Surgical History:  Procedure Laterality Date  . ABDOMINAL HYSTERECTOMY     Still has Ovaries  . SKIN CANCER EXCISION     x's 6 (Multiple sites)   Family History  Problem Relation Age of Onset  . Arthritis Mother   . Alzheimer's disease Mother   . Kidney disease Mother   . Diabetes Brother  History  Sexual Activity  . Sexual activity: Yes  . Partners: Male    Outpatient Encounter Prescriptions as of 06/17/2016  Medication Sig  . aspirin 325 MG tablet Take 325 mg by mouth daily.  . Cholecalciferol (VITAMIN D-3) 1000 units CAPS Take 2 Units by mouth.  . ergocalciferol (VITAMIN D2) 50000 units capsule Take 1 capsule (50,000 Units total) by mouth once a week.  . Multiple Vitamin (MULTIVITAMIN) capsule Take 1 capsule by mouth daily.  . [DISCONTINUED] azithromycin (ZITHROMAX) 250 MG  tablet Use as a zpack  . meclizine (ANTIVERT) 25 MG tablet Take 1 tablet (25 mg total) by mouth 3 (three) times daily as needed for dizziness.  . [DISCONTINUED] guaiFENesin-codeine (CHERATUSSIN AC) 100-10 MG/5ML syrup Take 10 mLs by mouth 3 (three) times daily as needed for cough. (Patient not taking: Reported on 06/17/2016)   No facility-administered encounter medications on file as of 06/17/2016.     Activities of Daily Living In your present state of health, do you have any difficulty performing the following activities: 06/17/2016  Hearing? N  Vision? N  Difficulty concentrating or making decisions? N  Walking or climbing stairs? N  Dressing or bathing? N  Doing errands, shopping? N  Some recent data might be hidden    Patient Care Team: Ann Held, DO as PCP - General (Family Medicine) Donavan Burnet, MD (Dermatology)    Assessment:    CPE  Exercise Activities and Dietary recommendations Current Exercise Habits: Structured exercise class, Type of exercise: strength training/weights;calisthenics, Time (Minutes): 60, Frequency (Times/Week): 4, Weekly Exercise (Minutes/Week): 240, Intensity: Intense, Exercise limited by: None identified  Goals    None     Fall Risk Fall Risk  06/17/2016 03/27/2015 03/23/2014  Falls in the past year? Yes Yes No  Number falls in past yr: 1 1 -  Injury with Fall? Yes No -   Depression Screen PHQ 2/9 Scores 06/17/2016 03/27/2015 03/23/2014  PHQ - 2 Score 0 0 1     Cognitive Function MMSE - Mini Mental State Exam 06/17/2016  Orientation to time 5  Orientation to Place 5  Registration 3  Attention/ Calculation 5  Recall 3  Language- name 2 objects 2  Language- repeat 1  Language- follow 3 step command 3  Language- read & follow direction 1  Write a sentence 1  Copy design 1  Total score 30        Immunization History  Administered Date(s) Administered  . Tdap 07/03/2011   Screening Tests Health Maintenance  Topic Date  Due  . COLONOSCOPY  02/15/1999  . PNA vac Low Risk Adult (1 of 2 - PCV13) 02/14/2014  . INFLUENZA VACCINE  10/08/2016  . MAMMOGRAM  04/24/2017  . TETANUS/TDAP  07/02/2021  . DEXA SCAN  Completed  . Hepatitis C Screening  Completed      Plan:   see AVS ghm utd During the course of the visit the patient was educated and counseled about the following appropriate screening and preventive services:   Vaccines to include Pneumoccal, Influenza, Hepatitis B, Td, Zostavax, HCV  Electrocardiogram  Cardiovascular Disease  Colorectal cancer screening  Bone density screening  Diabetes screening  Glaucoma screening  Mammography/PAP  Nutrition counseling   Patient Instructions (the written plan) was given to the patient.  1. Encounter for Medicare annual wellness exam See above  2. Preventative health care   - Ambulatory referral to Gastroenterology  3. Screening for breast cancer   - MM Digital Screening; Future  4. Estrogen deficiency   - DG Bone Density; Future - CBC - Comprehensive metabolic panel  5. Vitamin D deficiency   - VITAMIN D 25 Hydroxy (Vit-D Deficiency, Fractures)  6. Hx of hyperlipidemia Check labs  Encouraged heart healthy diet, increase exercise, avoid trans fats, consider a krill oil cap daily  7. Vertigo Ongoing problem that recently worsened Check labs Meclizine rto if worsens ekg NSR  - meclizine (ANTIVERT) 25 MG tablet; Take 1 tablet (25 mg total) by mouth 3 (three) times daily as needed for dizziness.  Dispense: 30 tablet; Refill: 0 - EKG 12-Lead  8. Mixed hyperlipidemia Check labs - Lipid panel CMA served as scribe during this visit. History, Physical and Plan performed by medical provider. Documentation and orders reviewed and attested to.  [place], DO  06/29/2016

## 2016-06-17 NOTE — Patient Instructions (Signed)
Preventive Care 100 Years and Older, Female Preventive care refers to lifestyle choices and visits with your health care provider that can promote health and wellness. What does preventive care include? A yearly physical exam. This is also called an annual well check. Dental exams once or twice a year. Routine eye exams. Ask your health care provider how often you should have your eyes checked. Personal lifestyle choices, including: Daily care of your teeth and gums. Regular physical activity. Eating a healthy diet. Avoiding tobacco and drug use. Limiting alcohol use. Practicing safe sex. Taking low-dose aspirin every day. Taking vitamin and mineral supplements as recommended by your health care provider. What happens during an annual well check? The services and screenings done by your health care provider during your annual well check will depend on your age, overall health, lifestyle risk factors, and family history of disease. Counseling  Your health care provider may ask you questions about your: Alcohol use. Tobacco use. Drug use. Emotional well-being. Home and relationship well-being. Sexual activity. Eating habits. History of falls. Memory and ability to understand (cognition). Work and work Statistician. Reproductive health. Screening  You may have the following tests or measurements: Height, weight, and BMI. Blood pressure. Lipid and cholesterol levels. These may be checked every 5 years, or more frequently if you are over 64 years old. Skin check. Lung cancer screening. You may have this screening every year starting at age 51 if you have a 30-pack-year history of smoking and currently smoke or have quit within the past 15 years. Fecal occult blood test (FOBT) of the stool. You may have this test every year starting at age 59. Flexible sigmoidoscopy or colonoscopy. You may have a sigmoidoscopy every 5 years or a colonoscopy every 10 years starting at age 38. Hepatitis  C blood test. Hepatitis B blood test. Sexually transmitted disease (STD) testing. Diabetes screening. This is done by checking your blood sugar (glucose) after you have not eaten for a while (fasting). You may have this done every 1-3 years. Bone density scan. This is done to screen for osteoporosis. You may have this done starting at age 30. Mammogram. This may be done every 1-2 years. Talk to your health care provider about how often you should have regular mammograms. Talk with your health care provider about your test results, treatment options, and if necessary, the need for more tests. Vaccines  Your health care provider may recommend certain vaccines, such as: Influenza vaccine. This is recommended every year. Tetanus, diphtheria, and acellular pertussis (Tdap, Td) vaccine. You may need a Td booster every 10 years. Varicella vaccine. You may need this if you have not been vaccinated. Zoster vaccine. You may need this after age 4. Measles, mumps, and rubella (MMR) vaccine. You may need at least one dose of MMR if you were born in 1957 or later. You may also need a second dose. Pneumococcal 13-valent conjugate (PCV13) vaccine. One dose is recommended after age 29. Pneumococcal polysaccharide (PPSV23) vaccine. One dose is recommended after age 63. Meningococcal vaccine. You may need this if you have certain conditions. Hepatitis A vaccine. You may need this if you have certain conditions or if you travel or work in places where you may be exposed to hepatitis A. Hepatitis B vaccine. You may need this if you have certain conditions or if you travel or work in places where you may be exposed to hepatitis B. Haemophilus influenzae type b (Hib) vaccine. You may need this if you have certain conditions.  Talk to your health care provider about which screenings and vaccines you need and how often you need them. This information is not intended to replace advice given to you by your health care  provider. Make sure you discuss any questions you have with your health care provider. Document Released: 03/23/2015 Document Revised: 11/14/2015 Document Reviewed: 12/26/2014 Elsevier Interactive Patient Education  2017 [employer]. How to Perform the Epley Maneuver The Epley maneuver is an exercise that relieves symptoms of vertigo. Vertigo is the feeling that you or your surroundings are moving when they are not. When you feel vertigo, you may feel like the room is spinning and have trouble walking. Dizziness is a little different than vertigo. When you are dizzy, you may feel unsteady or light-headed. You can do this maneuver at home whenever you have symptoms of vertigo. You can do it up to 3 times a day until your symptoms go away. Even though the Epley maneuver may relieve your vertigo for a few weeks, it is possible that your symptoms will return. This maneuver relieves vertigo, but it does not relieve dizziness. What are the risks? If it is done correctly, the Epley maneuver is considered safe. Sometimes it can lead to dizziness or nausea that goes away after a short time. If you develop other symptoms, such as changes in vision, weakness, or numbness, stop doing the maneuver and call your health care provider. How to perform the Epley maneuver 1. Sit on the edge of a bed or table with your back straight and your legs extended or hanging over the edge of the bed or table. 2. Turn your head halfway toward the affected ear or side. 3. Lie backward quickly with your head turned until you are lying flat on your back. You may want to position a pillow under your shoulders. 4. Hold this position for 30 seconds. You may experience an attack of vertigo. This is normal. 5. Turn your head to the opposite direction until your unaffected ear is facing the floor. 6. Hold this position for 30 seconds. You may experience an attack of vertigo. This is normal. Hold this position until the vertigo  stops. 7. Turn your whole body to the same side as your head. Hold for another 30 seconds. 8. Sit back up. You can repeat this exercise up to 3 times a day. Follow these instructions at home:  After doing the Epley maneuver, you can return to your normal activities.  Ask your health care provider if there is anything you should do at home to prevent vertigo. He or she may recommend that you:  Keep your head raised (elevated) with two or more pillows while you sleep.  Do not sleep on the side of your affected ear.  Get up slowly from bed.  Avoid sudden movements during the day.  Avoid extreme head movement, like looking up or bending over. Contact a health care provider if:  Your vertigo gets worse.  You have other symptoms, including:  Nausea.  Vomiting.  Headache. Get help right away if:  You have vision changes.  You have a severe or worsening headache or neck pain.  You cannot stop vomiting.  You have new numbness or weakness in any part of your body. Summary  Vertigo is the feeling that you or your surroundings are moving when they are not.  The Epley maneuver is an exercise that relieves symptoms of vertigo.  If the Epley maneuver is done correctly, it is considered safe. You  can do it up to 3 times a day. This information is not intended to replace advice given to you by your health care provider. Make sure you discuss any questions you have with your health care provider. Document Released: 03/01/2013 Document Revised: 01/15/2016 Document Reviewed: 01/15/2016 Elsevier Interactive Patient Education  2017 [employer].

## 2016-06-19 ENCOUNTER — Other Ambulatory Visit: Payer: Self-pay | Admitting: Family Medicine

## 2016-06-19 DIAGNOSIS — E785 Hyperlipidemia, unspecified: Secondary | ICD-10-CM

## 2016-06-25 ENCOUNTER — Encounter: Payer: Self-pay | Admitting: Gastroenterology

## 2016-06-29 ENCOUNTER — Encounter: Payer: Self-pay | Admitting: Family Medicine

## 2016-07-08 ENCOUNTER — Ambulatory Visit (INDEPENDENT_AMBULATORY_CARE_PROVIDER_SITE_OTHER): Payer: Medicare Other

## 2016-07-08 DIAGNOSIS — M85851 Other specified disorders of bone density and structure, right thigh: Secondary | ICD-10-CM | POA: Diagnosis not present

## 2016-07-08 DIAGNOSIS — E2839 Other primary ovarian failure: Secondary | ICD-10-CM

## 2016-07-08 DIAGNOSIS — Z1231 Encounter for screening mammogram for malignant neoplasm of breast: Secondary | ICD-10-CM

## 2016-07-08 DIAGNOSIS — Z1239 Encounter for other screening for malignant neoplasm of breast: Secondary | ICD-10-CM

## 2016-08-06 ENCOUNTER — Ambulatory Visit (AMBULATORY_SURGERY_CENTER): Payer: Self-pay | Admitting: *Deleted

## 2016-08-06 VITALS — Ht 62.0 in | Wt 148.0 lb

## 2016-08-06 DIAGNOSIS — Z1211 Encounter for screening for malignant neoplasm of colon: Secondary | ICD-10-CM

## 2016-08-06 MED ORDER — NA SULFATE-K SULFATE-MG SULF 17.5-3.13-1.6 GM/177ML PO SOLN: 1.0000 | Freq: Once | ORAL | 0 refills | Status: AC

## 2016-08-06 NOTE — Progress Notes (Signed)
No egg or soy allergy known to patient  No issues with past sedation with any surgeries  or procedures, no intubation problems  No diet pills per patient No home 02 use per patient  No blood thinners per patient  Pt denies issues with constipation  No A fib or A flutter  EMMI video sent to pt's e mail  

## 2016-08-26 ENCOUNTER — Encounter: Payer: Medicare Other | Admitting: Gastroenterology

## 2016-09-18 ENCOUNTER — Other Ambulatory Visit: Payer: Medicare Other

## 2016-10-15 ENCOUNTER — Encounter: Payer: Self-pay | Admitting: Family Medicine

## 2016-10-15 ENCOUNTER — Ambulatory Visit (INDEPENDENT_AMBULATORY_CARE_PROVIDER_SITE_OTHER): Payer: Medicare Other | Admitting: Family Medicine

## 2016-10-15 VITALS — BP 140/80 | HR 66 | Temp 98.5°F | Ht 62.0 in | Wt 146.8 lb

## 2016-10-15 DIAGNOSIS — R58 Hemorrhage, not elsewhere classified: Secondary | ICD-10-CM

## 2016-10-15 DIAGNOSIS — T148XXA Other injury of unspecified body region, initial encounter: Secondary | ICD-10-CM
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
## 2016-10-15 NOTE — Patient Instructions (Signed)
Heat (pad or rice pillow in microwave) over affected area, 10-15 minutes every 2-3 hours while awake.   Ice/cold pack over area for 10-15 min every 2-3 hours while awake.  OK to take Tylenol 1000 mg (2 extra strength tabs) or 975 mg (3 regular strength tabs) every 6 hours as needed.  Things to look out for: increasing pain not relieved by ibuprofen/acetaminophen, fevers, spreading redness, drainage of pus, or foul odor.  Let us know if you need anything.

## 2016-12-19 ENCOUNTER — Other Ambulatory Visit: Payer: Medicare Other

## 2017-01-06 ENCOUNTER — Other Ambulatory Visit (INDEPENDENT_AMBULATORY_CARE_PROVIDER_SITE_OTHER): Payer: Medicare Other

## 2017-01-06 DIAGNOSIS — E785 Hyperlipidemia, unspecified: Secondary | ICD-10-CM

## 2017-01-06 LAB — COMPREHENSIVE METABOLIC PANEL
ALT: 15 U/L (ref 0–35)
AST: 18 U/L (ref 0–37)
Albumin: 4.1 g/dL (ref 3.5–5.2)
Alkaline Phosphatase: 59 U/L (ref 39–117)
BUN: 11 mg/dL (ref 6–23)
CO2: 29 mEq/L (ref 19–32)
Calcium: 9.2 mg/dL (ref 8.4–10.5)
Chloride: 104 mEq/L (ref 96–112)
Creatinine, Ser: 0.7 mg/dL (ref 0.40–1.20)
GFR: 88.47 mL/min (ref >60.00)
Glucose, Bld: 102 mg/dL — ABNORMAL HIGH (ref 70–99)
Potassium: 4.3 mEq/L (ref 3.5–5.1)
Sodium: 140 mEq/L (ref 135–145)
Total Bilirubin: 0.5 mg/dL (ref 0.2–1.2)
Total Protein: 6.8 g/dL (ref 6.0–8.3)

## 2017-01-06 LAB — LIPID PANEL
Cholesterol: 251 mg/dL — ABNORMAL HIGH (ref 0–200)
HDL: 98.2 mg/dL (ref >39.00)
LDL Cholesterol: 139 mg/dL — ABNORMAL HIGH (ref 0–99)
NonHDL: 153.29
Total CHOL/HDL Ratio: 3
Triglycerides: 72 mg/dL (ref 0.0–149.0)
VLDL: 14.4 mg/dL (ref 0.0–40.0)

## 2017-07-02 ENCOUNTER — Other Ambulatory Visit: Payer: Self-pay | Admitting: Family Medicine

## 2017-07-02 DIAGNOSIS — Z1239 Encounter for other screening for malignant neoplasm of breast: Secondary | ICD-10-CM

## 2017-07-28 DIAGNOSIS — H25813 Combined forms of age-related cataract, bilateral: Secondary | ICD-10-CM | POA: Diagnosis not present

## 2017-07-28 DIAGNOSIS — H527 Unspecified disorder of refraction: Secondary | ICD-10-CM | POA: Diagnosis not present

## 2017-07-29 ENCOUNTER — Ambulatory Visit (INDEPENDENT_AMBULATORY_CARE_PROVIDER_SITE_OTHER): Payer: Medicare Other

## 2017-07-29 DIAGNOSIS — Z1231 Encounter for screening mammogram for malignant neoplasm of breast: Secondary | ICD-10-CM

## 2017-07-29 DIAGNOSIS — Z1239 Encounter for other screening for malignant neoplasm of breast: Secondary | ICD-10-CM

## 2017-10-11 DIAGNOSIS — N3000 Acute cystitis without hematuria: Secondary | ICD-10-CM | POA: Diagnosis not present

## 2017-10-11 DIAGNOSIS — R300 Dysuria: Secondary | ICD-10-CM | POA: Diagnosis not present

## 2017-10-14 ENCOUNTER — Telehealth: Payer: Self-pay | Admitting: *Deleted

## 2017-10-14 NOTE — Telephone Encounter (Signed)
Copied from [place]. Topic: Quick Communication - See Telephone Encounter >> Oct 13, 2017  4:37 PM Antonieta Iba C wrote: CRM for notification. See Telephone encounter for: 10/13/17.  Pt was seen by an urgent care for for vaginal discomfort and was prescribed Cipro floxatine 500MG , pt would like to know from provider if that medication is the best to prescribe?    CB: [phone]- please call pt at.

## 2017-10-15 NOTE — Telephone Encounter (Signed)
Left message to call back  

## 2017-10-15 NOTE — Telephone Encounter (Signed)
Left message on machine to call back with name of urgent care so we can get the ov notes.  We have to see what was done.

## 2017-10-15 NOTE — Telephone Encounter (Signed)
What is the question. Is she still having symptoms so we need to recheck UA and c and s? [place] with me and then she has to sign the Rel of Rec to get the UC notes

## 2017-10-15 NOTE — Telephone Encounter (Signed)
Pt calling and states it was [facility] in [place]  Pt had a UTI.  Pt states she feels better, but still a little discomfort in her lower back.  Phone: [phone]

## 2017-10-15 NOTE — Telephone Encounter (Signed)
Requesting ov notes and labs.  Patient stated that she did have UTI.  Please advise.

## 2017-10-16 NOTE — Progress Notes (Signed)
Subjective:   Margaret Foster is a 67 y.o. female who presents for Medicare Annual (Subsequent) preventive examination.  Review of Systems: No ROS.  Medicare Wellness Visit. Additional risk factors are reflected in the social history. Cardiac Risk Factors include: none Sleep patterns: no issues. Home Safety/Smoke Alarms: Feels safe in home. Smoke alarms in place.  Living environment; residence and Firearm Safety: Lives in 3 story home. Lives with husband.   Female:   Pap- hysterectomy      Mammo- utd Dexa scan- utd       CCS-cologuard ordered     Objective:     Vitals: BP 140/72 (BP Location: Left Arm, Patient Position: Sitting, Cuff Size: Normal)   Pulse 89   Ht 5\' 2"  (1.575 m)   Wt 152 lb (68.9 kg)   SpO2 96%   BMI 27.80 kg/m   Body mass index is 27.8 kg/m.  Advanced Directives 10/22/2017 08/06/2016 06/17/2016 03/27/2015  Does Patient Have a Medical Advance Directive? Yes Yes Yes Yes  Type of Paramedic of [place];Living will Living will;Healthcare Power of [name];Living will  Does patient want to make changes to medical advance directive? - - No - Patient declined No - Patient declined  Copy of [place] in Chart? No - copy requested - No - copy requested No - copy requested    Tobacco Social History   Tobacco Use  Smoking Status Never Smoker  Smokeless Tobacco Never Used     Counseling given: Not Answered   Clinical Intake:     Pain : No/denies pain                 Past Medical History:  Diagnosis Date  . Chicken pox   . GERD (gastroesophageal reflux disease)   . Heart murmur   . Osteopenia   . Skin cancer (melanoma) (HCC)    Arm  . Skin cancer, basal cell    Face, Shoulder, Back   Past Surgical History:  Procedure Laterality Date  . ABDOMINAL HYSTERECTOMY     Still has Ovaries  . COLONOSCOPY    . SKIN CANCER EXCISION     x's 6  (Multiple sites)   Family History  Problem Relation Age of Onset  . Arthritis Mother   . Alzheimer's disease Mother   . Kidney disease Mother   . Diabetes Brother   . Stomach cancer Maternal Uncle   . Colon cancer Neg Hx   . Colon polyps Neg Hx   . Esophageal cancer Neg Hx   . Rectal cancer Neg Hx    Social History   Socioeconomic History  . Marital status: Married    Spouse name: Not on file  . Number of children: Not on file  . Years of education: Not on file  . Highest education level: Not on file  Occupational History    Employer: [employer]    Comment: retired  Scientific laboratory technician  . Financial resource strain: Not on file  . Food insecurity:    Worry: Not on file    Inability: Not on file  . Transportation needs:    Medical: Not on file    Non-medical: Not on file  Tobacco Use  . Smoking status: Never Smoker  . Smokeless tobacco: Never Used  Substance and Sexual Activity  . Alcohol use: Yes    Alcohol/week: 0.0 standard drinks    Comment: Socially  . Drug use: No  .  Sexual activity: Yes    Partners: Male  Lifestyle  . Physical activity:    Days per week: Not on file    Minutes per session: Not on file  . Stress: Not on file  Relationships  . Social connections:    Talks on phone: Not on file    Gets together: Not on file    Attends religious service: Not on file    Active member of club or organization: Not on file    Attends meetings of clubs or organizations: Not on file    Relationship status: Not on file  Other Topics Concern  . Not on file  Social History Narrative   Exercise 4x a week    Outpatient Encounter Medications as of 10/22/2017  Medication Sig  . Multiple Vitamin (MULTIVITAMIN) capsule Take 1 capsule by mouth daily.  Marland Kitchen aspirin 325 MG tablet Take 325 mg by mouth daily.  . Flaxseed, Linseed, (FLAX SEEDS PO) Take by mouth. Take 2 gel capsules by mouth daily.  . meclizine (ANTIVERT) 25 MG tablet Take 1 tablet (25 mg total) by mouth 3 (three)  times daily as needed for dizziness. (Patient not taking: Reported on 08/06/2016)   No facility-administered encounter medications on file as of 10/22/2017.     Activities of Daily Living In your present state of health, do you have any difficulty performing the following activities: 10/22/2017  Hearing? N  Vision? N  Difficulty concentrating or making decisions? N  Walking or climbing stairs? N  Dressing or bathing? N  Doing errands, shopping? N  Preparing Food and eating ? N  Using the Toilet? N  In the past six months, have you accidently leaked urine? N  Do you have problems with loss of bowel control? N  Managing your Medications? N  Managing your Finances? N  Housekeeping or managing your Housekeeping? N  Some recent data might be hidden    Patient Care Team: Carollee Herter, Alferd Apa, DO as PCP - General (Family Medicine) Donavan Burnet, MD (Dermatology)    Assessment:   This is a routine wellness examination for [place]. Physical assessment deferred to PCP.  Exercise Activities and Dietary recommendations Current Exercise Habits: Home exercise routine;Structured exercise class, Type of exercise: strength training/weights;calisthenics, Time (Minutes): 60, Frequency (Times/Week): 4, Weekly Exercise (Minutes/Week): 240, Exercise limited by: None identified Diet (meal preparation, eat out, water intake, caffeinated beverages, dairy products, fruits and vegetables): in general, a "healthy" diet  , well balanced  Goals    . Maintain healthy active lifestyle.       Fall Risk Fall Risk  10/22/2017 06/17/2016 03/27/2015 03/23/2014  Falls in the past year? No Yes Yes No  Comment - - Issues with her shoes -  Number falls in past yr: - 1 1 -  Injury with Fall? - Yes No -  Comment - pt report she tripped off the steps and broke her toe - -    Depression Screen PHQ 2/9 Scores 10/22/2017 06/17/2016 03/27/2015 03/23/2014  PHQ - 2 Score 0 0 0 1     Cognitive Function Ad8 score reviewed  for issues:  Issues making decisions:no  Less interest in hobbies / activities:no  Repeats questions, stories (family complaining):no  Trouble using ordinary gadgets (microwave, computer, phone):no  Forgets the month or year: no  Mismanaging finances: no  Remembering appts:no  Daily problems with thinking and/or memory:no Ad8 score is=0   MMSE - Mini Mental State Exam 06/17/2016  Orientation to time 5  Orientation  to Place 5  Registration 3  Attention/ Calculation 5  Recall 3  Language- name 2 objects 2  Language- repeat 1  Language- follow 3 step command 3  Language- read & follow direction 1  Write a sentence 1  Copy design 1  Total score 30        Immunization History  Administered Date(s) Administered  . Tdap 07/03/2011    Screening Tests Health Maintenance  Topic Date Due  . COLONOSCOPY  02/15/1999  . PNA vac Low Risk Adult (1 of 2 - PCV13) 02/14/2014  . INFLUENZA VACCINE  10/08/2017  . MAMMOGRAM  07/30/2019  . TETANUS/TDAP  07/02/2021  . DEXA SCAN  Completed  . Hepatitis C Screening  Completed      Plan:    Please schedule your next medicare wellness visit with me in 1 yr.  Continue to eat heart healthy diet (full of fruits, vegetables, whole grains, lean protein, water--limit salt, fat, and sugar intake) and increase physical activity as tolerated.  Bring a copy of your living will and/or healthcare power of attorney to your next office visit.  I have ordered your cologuard.  Follow up with PCP today as scheduled to discuss follow up from previous UTI and discuss worsening fatigue.  I have personally reviewed and noted the following in the patient's chart:   . Medical and social history . Use of alcohol, tobacco or illicit drugs  . Current medications and supplements . Functional ability and status . Nutritional status . Physical activity . Advanced directives . List of other physicians . Hospitalizations, surgeries, and ER visits in  previous 12 months . Vitals . Screenings to include cognitive, depression, and falls . Referrals and appointments  In addition, I have reviewed and discussed with patient certain preventive protocols, quality metrics, and best practice recommendations. A written personalized care plan for preventive services as well as general preventive health recommendations were provided to patient.     Shela Nevin, [place]  10/22/2017

## 2017-10-19 NOTE — Telephone Encounter (Signed)
Patient seen in office on 10/15/17

## 2017-10-22 ENCOUNTER — Telehealth: Payer: Self-pay

## 2017-10-22 ENCOUNTER — Ambulatory Visit (INDEPENDENT_AMBULATORY_CARE_PROVIDER_SITE_OTHER): Payer: Medicare Other | Admitting: *Deleted

## 2017-10-22 ENCOUNTER — Ambulatory Visit (INDEPENDENT_AMBULATORY_CARE_PROVIDER_SITE_OTHER): Payer: Medicare Other | Admitting: Family Medicine

## 2017-10-22 ENCOUNTER — Encounter: Payer: Self-pay | Admitting: Family Medicine

## 2017-10-22 ENCOUNTER — Encounter: Payer: Self-pay | Admitting: *Deleted

## 2017-10-22 VITALS — BP 140/72 | HR 89 | Ht 62.0 in | Wt 152.0 lb

## 2017-10-22 VITALS — BP 140/72 | HR 89 | Temp 98.2°F | Resp 16 | Ht 62.0 in | Wt 152.0 lb

## 2017-10-22 DIAGNOSIS — E559 Vitamin D deficiency, unspecified: Secondary | ICD-10-CM

## 2017-10-22 DIAGNOSIS — Z862 Personal history of diseases of the blood and blood-forming organs and certain disorders involving the immune mechanism: Secondary | ICD-10-CM

## 2017-10-22 DIAGNOSIS — Z87440 Personal history of urinary (tract) infections: Secondary | ICD-10-CM | POA: Diagnosis not present

## 2017-10-22 DIAGNOSIS — N8110 Cystocele, unspecified: Secondary | ICD-10-CM | POA: Diagnosis not present

## 2017-10-22 DIAGNOSIS — R5383 Other fatigue: Secondary | ICD-10-CM

## 2017-10-22 DIAGNOSIS — Z0000 Encounter for general adult medical examination without abnormal findings: Secondary | ICD-10-CM

## 2017-10-22 DIAGNOSIS — Z136 Encounter for screening for cardiovascular disorders: Secondary | ICD-10-CM | POA: Diagnosis not present

## 2017-10-22 DIAGNOSIS — N390 Urinary tract infection, site not specified: Secondary | ICD-10-CM | POA: Diagnosis not present

## 2017-10-22 DIAGNOSIS — Z1211 Encounter for screening for malignant neoplasm of colon: Secondary | ICD-10-CM

## 2017-10-22 LAB — COLOGUARD: Cologuard: NEGATIVE

## 2017-10-22 NOTE — Patient Instructions (Signed)
Preventive Care 65 Years and Older, Female Preventive care refers to lifestyle choices and visits with your health care provider that can promote health and wellness. What does preventive care include?  A yearly physical exam. This is also called an annual well check.  Dental exams once or twice a year.  Routine eye exams. Ask your health care provider how often you should have your eyes checked.  Personal lifestyle choices, including: ? Daily care of your teeth and gums. ? Regular physical activity. ? Eating a healthy diet. ? Avoiding tobacco and drug use. ? Limiting alcohol use. ? Practicing safe sex. ? Taking low-dose aspirin every day. ? Taking vitamin and mineral supplements as recommended by your health care provider. What happens during an annual well check? The services and screenings done by your health care provider during your annual well check will depend on your age, overall health, lifestyle risk factors, and family history of disease. Counseling Your health care provider may ask you questions about your:  Alcohol use.  Tobacco use.  Drug use.  Emotional well-being.  Home and relationship well-being.  Sexual activity.  Eating habits.  History of falls.  Memory and ability to understand (cognition).  Work and work environment.  Reproductive health.  Screening You may have the following tests or measurements:  Height, weight, and BMI.  Blood pressure.  Lipid and cholesterol levels. These may be checked every 5 years, or more frequently if you are over 50 years old.  Skin check.  Lung cancer screening. You may have this screening every year starting at age 55 if you have a 30-pack-year history of smoking and currently smoke or have quit within the past 15 years.  Fecal occult blood test (FOBT) of the stool. You may have this test every year starting at age 50.  Flexible sigmoidoscopy or colonoscopy. You may have a sigmoidoscopy every 5 years or  a colonoscopy every 10 years starting at age 50.  Hepatitis C blood test.  Hepatitis B blood test.  Sexually transmitted disease (STD) testing.  Diabetes screening. This is done by checking your blood sugar (glucose) after you have not eaten for a while (fasting). You may have this done every 1-3 years.  Bone density scan. This is done to screen for osteoporosis. You may have this done starting at age 65.  Mammogram. This may be done every 1-2 years. Talk to your health care provider about how often you should have regular mammograms.  Talk with your health care provider about your test results, treatment options, and if necessary, the need for more tests. Vaccines Your health care provider may recommend certain vaccines, such as:  Influenza vaccine. This is recommended every year.  Tetanus, diphtheria, and acellular pertussis (Tdap, Td) vaccine. You may need a Td booster every 10 years.  Varicella vaccine. You may need this if you have not been vaccinated.  Zoster vaccine. You may need this after age 60.  Measles, mumps, and rubella (MMR) vaccine. You may need at least one dose of MMR if you were born in 1957 or later. You may also need a second dose.  Pneumococcal 13-valent conjugate (PCV13) vaccine. One dose is recommended after age 65.  Pneumococcal polysaccharide (PPSV23) vaccine. One dose is recommended after age 65.  Meningococcal vaccine. You may need this if you have certain conditions.  Hepatitis A vaccine. You may need this if you have certain conditions or if you travel or work in places where you may be exposed to hepatitis   A.  Hepatitis B vaccine. You may need this if you have certain conditions or if you travel or work in places where you may be exposed to hepatitis B.  Haemophilus influenzae type b (Hib) vaccine. You may need this if you have certain conditions.  Talk to your health care provider about which screenings and vaccines you need and how often you  need them. This information is not intended to replace advice given to you by your health care provider. Make sure you discuss any questions you have with your health care provider. Document Released: 03/23/2015 Document Revised: 11/14/2015 Document Reviewed: 12/26/2014 Elsevier Interactive Patient Education  2018 [employer]  

## 2017-10-22 NOTE — Patient Instructions (Addendum)
Please schedule your next medicare wellness visit with me in 1 yr.  Continue to eat heart healthy diet (full of fruits, vegetables, whole grains, lean protein, water--limit salt, fat, and sugar intake) and increase physical activity as tolerated.  Continue doing brain stimulating activities (puzzles, reading, adult coloring books, staying active) to keep memory sharp.   I have ordered your cologuard.    Margaret Foster , Thank you for taking time to come for your Medicare Wellness Visit. I appreciate your ongoing commitment to your health goals. Please review the following plan we discussed and let me know if I can assist you in the future.   These are the goals we discussed: Goals    . Maintain healthy active lifestyle.       This is a list of the screening recommended for you and due dates:  Health Maintenance  Topic Date Due  . Colon Cancer Screening  02/15/1999  . Pneumonia vaccines (1 of 2 - PCV13) 02/14/2014  . Flu Shot  10/08/2017  . Mammogram  07/30/2019  . Tetanus Vaccine  07/02/2021  . DEXA scan (bone density measurement)  Completed  .  Hepatitis C: One time screening is recommended by Center for Disease Control  (CDC) for  adults born from [birth year] through 1967.   Completed    Health Maintenance for Postmenopausal Women Menopause is a normal process in which your reproductive ability comes to an end. This process happens gradually over a span of months to years, usually between the ages of 38 and 82. Menopause is complete when you have missed 12 consecutive menstrual periods. It is important to talk with your health care provider about some of the most common conditions that affect postmenopausal women, such as heart disease, cancer, and bone loss (osteoporosis). Adopting a healthy lifestyle and getting preventive care can help to promote your health and wellness. Those actions can also lower your chances of developing some of these common conditions. What should I know about  menopause? During menopause, you may experience a number of symptoms, such as:  Moderate-to-severe hot flashes.  Night sweats.  Decrease in sex drive.  Mood swings.  Headaches.  Tiredness.  Irritability.  Memory problems.  Insomnia.  Choosing to treat or not to treat menopausal changes is an individual decision that you make with your health care provider. What should I know about hormone replacement therapy and supplements? Hormone therapy products are effective for treating symptoms that are associated with menopause, such as hot flashes and night sweats. Hormone replacement carries certain risks, especially as you become older. If you are thinking about using estrogen or estrogen with progestin treatments, discuss the benefits and risks with your health care provider. What should I know about heart disease and stroke? Heart disease, heart attack, and stroke become more likely as you age. This may be due, in part, to the hormonal changes that your body experiences during menopause. These can affect how your body processes dietary fats, triglycerides, and cholesterol. Heart attack and stroke are both medical emergencies. There are many things that you can do to help prevent heart disease and stroke:  Have your blood pressure checked at least every 1-2 years. High blood pressure causes heart disease and increases the risk of stroke.  If you are 69-42 years old, ask your health care provider if you should take aspirin to prevent a heart attack or a stroke.  Do not use any tobacco products, including cigarettes, chewing tobacco, or electronic cigarettes. If you need  help quitting, ask your health care provider.  It is important to eat a healthy diet and maintain a healthy weight. ? Be sure to include plenty of vegetables, fruits, low-fat dairy products, and lean protein. ? Avoid eating foods that are high in solid fats, added sugars, or salt (sodium).  Get regular exercise. This  is one of the most important things that you can do for your health. ? Try to exercise for at least 150 minutes each week. The type of exercise that you do should increase your heart rate and make you sweat. This is known as moderate-intensity exercise. ? Try to do strengthening exercises at least twice each week. Do these in addition to the moderate-intensity exercise.  Know your numbers.Ask your health care provider to check your cholesterol and your blood glucose. Continue to have your blood tested as directed by your health care provider.  What should I know about cancer screening? There are several types of cancer. Take the following steps to reduce your risk and to catch any cancer development as early as possible. Breast Cancer  Practice breast self-awareness. ? This means understanding how your breasts normally appear and feel. ? It also means doing regular breast self-exams. Let your health care provider know about any changes, no matter how small.  If you are 69 or older, have a clinician do a breast exam (clinical breast exam or CBE) every year. Depending on your age, family history, and medical history, it may be recommended that you also have a yearly breast X-ray (mammogram).  If you have a family history of breast cancer, talk with your health care provider about genetic screening.  If you are at high risk for breast cancer, talk with your health care provider about having an MRI and a mammogram every year.  Breast cancer (BRCA) gene test is recommended for women who have family members with BRCA-related cancers. Results of the assessment will determine the need for genetic counseling and BRCA1 and for BRCA2 testing. BRCA-related cancers include these types: ? Breast. This occurs in males or females. ? Ovarian. ? Tubal. This may also be called fallopian tube cancer. ? Cancer of the abdominal or pelvic lining (peritoneal cancer). ? Prostate. ? Pancreatic.  Cervical,  Uterine, and Ovarian Cancer Your health care provider may recommend that you be screened regularly for cancer of the pelvic organs. These include your ovaries, uterus, and vagina. This screening involves a pelvic exam, which includes checking for microscopic changes to the surface of your cervix (Pap test).  For women ages 21-65, health care providers may recommend a pelvic exam and a Pap test every three years. For women ages 61-65, they may recommend the Pap test and pelvic exam, combined with testing for human papilloma virus (HPV), every five years. Some types of HPV increase your risk of cervical cancer. Testing for HPV may also be done on women of any age who have unclear Pap test results.  Other health care providers may not recommend any screening for nonpregnant women who are considered low risk for pelvic cancer and have no symptoms. Ask your health care provider if a screening pelvic exam is right for you.  If you have had past treatment for cervical cancer or a condition that could lead to cancer, you need Pap tests and screening for cancer for at least 20 years after your treatment. If Pap tests have been discontinued for you, your risk factors (such as having a new sexual partner) need to be reassessed  to determine if you should start having screenings again. Some women have medical problems that increase the chance of getting cervical cancer. In these cases, your health care provider may recommend that you have screening and Pap tests more often.  If you have a family history of uterine cancer or ovarian cancer, talk with your health care provider about genetic screening.  If you have vaginal bleeding after reaching menopause, tell your health care provider.  There are currently no reliable tests available to screen for ovarian cancer.  Lung Cancer Lung cancer screening is recommended for adults 48-49 years old who are at high risk for lung cancer because of a history of smoking. A  yearly low-dose CT scan of the lungs is recommended if you:  Currently smoke.  Have a history of at least 30 pack-years of smoking and you currently smoke or have quit within the past 15 years. A pack-year is smoking an average of one pack of cigarettes per day for one year.  Yearly screening should:  Continue until it has been 15 years since you quit.  Stop if you develop a health problem that would prevent you from having lung cancer treatment.  Colorectal Cancer  This type of cancer can be detected and can often be prevented.  Routine colorectal cancer screening usually begins at age 28 and continues through age 34.  If you have risk factors for colon cancer, your health care provider may recommend that you be screened at an earlier age.  If you have a family history of colorectal cancer, talk with your health care provider about genetic screening.  Your health care provider may also recommend using home test kits to check for hidden blood in your stool.  A small camera at the end of a tube can be used to examine your colon directly (sigmoidoscopy or colonoscopy). This is done to check for the earliest forms of colorectal cancer.  Direct examination of the colon should be repeated every 5-10 years until age 87. However, if early forms of precancerous polyps or small growths are found or if you have a family history or genetic risk for colorectal cancer, you may need to be screened more often.  Skin Cancer  Check your skin from head to toe regularly.  Monitor any moles. Be sure to tell your health care provider: ? About any new moles or changes in moles, especially if there is a change in a mole's shape or color. ? If you have a mole that is larger than the size of a pencil eraser.  If any of your family members has a history of skin cancer, especially at a young age, talk with your health care provider about genetic screening.  Always use sunscreen. Apply sunscreen liberally  and repeatedly throughout the day.  Whenever you are outside, protect yourself by wearing long sleeves, pants, a wide-brimmed hat, and sunglasses.  What should I know about osteoporosis? Osteoporosis is a condition in which bone destruction happens more quickly than new bone creation. After menopause, you may be at an increased risk for osteoporosis. To help prevent osteoporosis or the bone fractures that can happen because of osteoporosis, the following is recommended:  If you are 56-42 years old, get at least 1,000 mg of calcium and at least 600 mg of vitamin D per day.  If you are older than age 62 but younger than age 90, get at least 1,200 mg of calcium and at least 600 mg of vitamin D per day.  If you are older than age 39, get at least 1,200 mg of calcium and at least 800 mg of vitamin D per day.  Smoking and excessive alcohol intake increase the risk of osteoporosis. Eat foods that are rich in calcium and vitamin D, and do weight-bearing exercises several times each week as directed by your health care provider. What should I know about how menopause affects my mental health? Depression may occur at any age, but it is more common as you become older. Common symptoms of depression include:  Low or sad mood.  Changes in sleep patterns.  Changes in appetite or eating patterns.  Feeling an overall lack of motivation or enjoyment of activities that you previously enjoyed.  Frequent crying spells.  Talk with your health care provider if you think that you are experiencing depression. What should I know about immunizations? It is important that you get and maintain your immunizations. These include:  Tetanus, diphtheria, and pertussis (Tdap) booster vaccine.  Influenza every year before the flu season begins.  Pneumonia vaccine.  Shingles vaccine.  Your health care provider may also recommend other immunizations. This information is not intended to replace advice given to you  by your health care provider. Make sure you discuss any questions you have with your health care provider. Document Released: 04/18/2005 Document Revised: 09/14/2015 Document Reviewed: 11/28/2014 Elsevier Interactive Patient Education  2018 [employer].

## 2017-10-22 NOTE — Progress Notes (Signed)
Reviewed  Yvonne R Lowne Chase, DO  

## 2017-10-22 NOTE — Telephone Encounter (Signed)
Cologuard ordered through online portal.  

## 2017-10-22 NOTE — Progress Notes (Signed)
Subjective:     Margaret Foster is a 67 y.o. female and is here for a comprehensive physical exam. The patient reports problems - fatigue-- hx of anemia and vita d def. She also recently had a uti and would like this rechecked.    Social History   Socioeconomic History  . Marital status: Married    Spouse name: Not on file  . Number of children: Not on file  . Years of education: Not on file  . Highest education level: Not on file  Occupational History    Employer: [employer]    Comment: retired  Scientific laboratory technician  . Financial resource strain: Not on file  . Food insecurity:    Worry: Not on file    Inability: Not on file  . Transportation needs:    Medical: Not on file    Non-medical: Not on file  Tobacco Use  . Smoking status: Never Smoker  . Smokeless tobacco: Never Used  Substance and Sexual Activity  . Alcohol use: Yes    Alcohol/week: 0.0 standard drinks    Comment: Socially  . Drug use: No  . Sexual activity: Yes    Partners: Male  Lifestyle  . Physical activity:    Days per week: Not on file    Minutes per session: Not on file  . Stress: Not on file  Relationships  . Social connections:    Talks on phone: Not on file    Gets together: Not on file    Attends religious service: Not on file    Active member of club or organization: Not on file    Attends meetings of clubs or organizations: Not on file    Relationship status: Not on file  . Intimate partner violence:    Fear of current or ex partner: Not on file    Emotionally abused: Not on file    Physically abused: Not on file    Forced sexual activity: Not on file  Other Topics Concern  . Not on file  Social History Narrative   Exercise 4x a week   Health Maintenance  Topic Date Due  . COLONOSCOPY  02/15/1999  . PNA vac Low Risk Adult (1 of 2 - PCV13) 02/14/2014  . INFLUENZA VACCINE  10/08/2017  . MAMMOGRAM  07/30/2018  . TETANUS/TDAP  07/02/2021  . DEXA SCAN  Completed  . Hepatitis C Screening   Completed    The following portions of the patient's history were reviewed and updated as appropriate:  She  has a past medical history of Chicken pox, GERD (gastroesophageal reflux disease), Heart murmur, Osteopenia, Skin cancer (melanoma) ([place]), and Skin cancer, basal cell. She does not have any pertinent problems on file. She  has a past surgical history that includes Skin cancer excision; Abdominal hysterectomy; Colonoscopy; and Bladder suspension. Her family history includes Alzheimer's disease in her mother; Arthritis in her mother; Diabetes in her brother; Kidney disease in her mother; Stomach cancer in her maternal uncle. She  reports that she has never smoked. She has never used smokeless tobacco. She reports that she drinks alcohol. She reports that she does not use drugs. She has a current medication list which includes the following prescription(s): aspirin, flaxseed (linseed), meclizine, and multivitamin. Current Outpatient Medications on File Prior to Visit  Medication Sig Dispense Refill  . aspirin 325 MG tablet Take 325 mg by mouth daily.    . Flaxseed, Linseed, (FLAX SEEDS PO) Take by mouth. Take 2 gel capsules by mouth  daily.    . meclizine (ANTIVERT) 25 MG tablet Take 1 tablet (25 mg total) by mouth 3 (three) times daily as needed for dizziness. 30 tablet 0  . Multiple Vitamin (MULTIVITAMIN) capsule Take 1 capsule by mouth daily.     No current facility-administered medications on file prior to visit.    She has No Known Allergies..  Review of Systems Review of Systems  Constitutional: Negative for activity change, appetite change and fatigue.  HENT: Negative for hearing loss, congestion, tinnitus and ear discharge.  dentist q68m Eyes: Negative for visual disturbance (see optho q1y -- vision corrected to 20/20 with glasses).  Respiratory: Negative for cough, chest tightness and shortness of breath.   Cardiovascular: Negative for chest pain, palpitations and leg swelling.   Gastrointestinal: Negative for abdominal pain, diarrhea, constipation and abdominal distention.  Genitourinary: Negative for urgency, frequency, decreased urine volume and difficulty urinating.  Musculoskeletal: + L knee pain Skin: Negative for color change, pallor and rash.  Neurological: Negative for dizziness, light-headedness, numbness and headaches.  Hematological: Negative for adenopathy. Does not bruise/bleed easily.  Psychiatric/Behavioral: Negative for suicidal ideas, confusion, sleep disturbance, self-injury, dysphoric mood, decreased concentration and agitation.       Objective:    BP 140/72   Pulse 89   Temp 98.2 F (36.8 C) (Oral)   Resp 16   Ht 5\' 2"  (1.575 m)   Wt 152 lb (68.9 kg)   SpO2 96%   BMI 27.80 kg/m  General appearance: alert, cooperative, appears stated age and no distress Head: Normocephalic, without obvious abnormality, atraumatic Eyes: conjunctivae/corneas clear. PERRL, EOM's intact. Fundi benign. Ears: normal TM's and external ear canals both ears Nose: Nares normal. Septum midline. Mucosa normal. No drainage or sinus tenderness. Throat: lips, mucosa, and tongue normal; teeth and gums normal Neck: no adenopathy, no carotid bruit, no JVD, supple, symmetrical, trachea midline and thyroid not enlarged, symmetric, no tenderness/mass/nodules Back: symmetric, no curvature. ROM normal. No CVA tenderness. Lungs: clear to auscultation bilaterally Breasts: normal appearance, no masses or tenderness Heart: regular rate and rhythm, S1, S2 normal, no murmur, click, rub or gallop Abdomen: soft, non-tender; bowel sounds normal; no masses,  no organomegaly Pelvic: not indicated; status post hysterectomy, negative ROS Extremities: extremities normal, atraumatic, no cyanosis or edema Pulses: 2+ and symmetric Skin: Skin color, texture, turgor normal. No rashes or lesions Lymph nodes: Cervical, supraclavicular, and axillary nodes normal. Neurologic: Alert and  oriented X 3, normal strength and tone. Normal symmetric reflexes. Normal coordination and gait    Assessment:    Healthy female exam.      Plan:    ghm utd Check labs D/w pt shingrix--she will think about it See After Visit Summary for Counseling Recommendations    1. History of UTI Recheck urine - POCT Urinalysis Dipstick (Automated)  2. Screening for ischemic heart disease   - CBC with Differential/Platelet - Comprehensive metabolic panel - Lipid panel  3. Fatigue, unspecified type   - CBC with Differential/Platelet - Comprehensive metabolic panel - Vitamin Z61 - VITAMIN D 25 Hydroxy (Vit-D Deficiency, Fractures)  4. Urinary tract infection without hematuria, site unspecified   - Urine Culture  5. Vitamin D deficiency   - VITAMIN D 25 Hydroxy (Vit-D Deficiency, Fractures)  6. History of anemia   - CBC with Differential/Platelet - Vitamin B12  7. Bladder prolapse, female, acquired Pt requesting to see a specialist to f/ u this  - Ambulatory referral to Urogynecology

## 2017-10-23 ENCOUNTER — Other Ambulatory Visit: Payer: Self-pay | Admitting: *Deleted

## 2017-10-23 DIAGNOSIS — Z1211 Encounter for screening for malignant neoplasm of colon: Secondary | ICD-10-CM

## 2017-10-23 LAB — CBC WITH DIFFERENTIAL/PLATELET
Basophils Absolute: 0.1 10*3/uL (ref 0.0–0.1)
Basophils Relative: 1.3 % (ref 0.0–3.0)
Eosinophils Absolute: 0.1 10*3/uL (ref 0.0–0.7)
Eosinophils Relative: 2.6 % (ref 0.0–5.0)
HCT: 41.2 % (ref 36.0–46.0)
Hemoglobin: 13.7 g/dL (ref 12.0–15.0)
Lymphocytes Relative: 29.1 % (ref 12.0–46.0)
Lymphs Abs: 1.5 10*3/uL (ref 0.7–4.0)
MCHC: 33.2 g/dL (ref 30.0–36.0)
MCV: 98.2 fl (ref 78.0–100.0)
Monocytes Absolute: 0.6 10*3/uL (ref 0.1–1.0)
Monocytes Relative: 10.9 % (ref 3.0–12.0)
Neutro Abs: 2.9 10*3/uL (ref 1.4–7.7)
Neutrophils Relative %: 56.1 % (ref 43.0–77.0)
Platelets: 284 10*3/uL (ref 150.0–400.0)
RBC: 4.19 Mil/uL (ref 3.87–5.11)
RDW: 13.6 % (ref 11.5–15.5)
WBC: 5.1 10*3/uL (ref 4.0–10.5)

## 2017-10-23 LAB — COMPREHENSIVE METABOLIC PANEL
ALT: 22 U/L (ref 0–35)
AST: 22 U/L (ref 0–37)
Albumin: 4.4 g/dL (ref 3.5–5.2)
Alkaline Phosphatase: 72 U/L (ref 39–117)
BUN: 17 mg/dL (ref 6–23)
CO2: 33 mEq/L — ABNORMAL HIGH (ref 19–32)
Calcium: 9.8 mg/dL (ref 8.4–10.5)
Chloride: 103 mEq/L (ref 96–112)
Creatinine, Ser: 0.8 mg/dL (ref 0.40–1.20)
GFR: 75.66 mL/min (ref >60.00)
Glucose, Bld: 83 mg/dL (ref 70–99)
Potassium: 4.3 mEq/L (ref 3.5–5.1)
Sodium: 142 mEq/L (ref 135–145)
Total Bilirubin: 0.6 mg/dL (ref 0.2–1.2)
Total Protein: 7.3 g/dL (ref 6.0–8.3)

## 2017-10-23 LAB — LIPID PANEL
Cholesterol: 247 mg/dL — ABNORMAL HIGH (ref 0–200)
HDL: 95.4 mg/dL (ref >39.00)
LDL Cholesterol: 127 mg/dL — ABNORMAL HIGH (ref 0–99)
NonHDL: 151.34
Total CHOL/HDL Ratio: 3
Triglycerides: 121 mg/dL (ref 0.0–149.0)
VLDL: 24.2 mg/dL (ref 0.0–40.0)

## 2017-10-23 LAB — VITAMIN D 25 HYDROXY (VIT D DEFICIENCY, FRACTURES): VITD: 26.92 ng/mL — ABNORMAL LOW (ref 30.00–100.00)

## 2017-10-23 LAB — VITAMIN B12: Vitamin B-12: 396 pg/mL (ref 211–911)

## 2017-10-24 LAB — URINE CULTURE
MICRO NUMBER:: 90971403
SPECIMEN QUALITY:: ADEQUATE

## 2017-10-26 MED ORDER — VITAMIN D (ERGOCALCIFEROL) 1.25 MG (50000 UNIT) PO CAPS: 50000.0000 [IU] | ORAL_CAPSULE | ORAL | 0 refills | Status: DC

## 2017-10-26 NOTE — Addendum Note (Signed)
Addended byDamita Dunnings D on: 10/26/2017 01:47 PM   Modules accepted: Orders

## 2017-11-03 DIAGNOSIS — Z1211 Encounter for screening for malignant neoplasm of colon: Secondary | ICD-10-CM | POA: Diagnosis not present

## 2017-11-03 LAB — HM DEXA SCAN: HM Dexa Scan: NEGATIVE

## 2017-12-22 DIAGNOSIS — N815 Vaginal enterocele: Secondary | ICD-10-CM | POA: Diagnosis not present

## 2017-12-22 DIAGNOSIS — N9489 Other specified conditions associated with female genital organs and menstrual cycle: Secondary | ICD-10-CM | POA: Diagnosis not present

## 2017-12-22 DIAGNOSIS — N8110 Cystocele, unspecified: Secondary | ICD-10-CM | POA: Diagnosis not present

## 2017-12-24 NOTE — Telephone Encounter (Signed)
Results printed- (negative)- placed on Sheketia's desk.

## 2017-12-24 NOTE — Telephone Encounter (Signed)
Pt called b/c she was curious about her cologuard results; she never heard back concerning the results; pt states all directions were followed and specimen was returned; contact pt to advise

## 2017-12-25 ENCOUNTER — Other Ambulatory Visit: Payer: Self-pay | Admitting: *Deleted

## 2017-12-25 NOTE — Telephone Encounter (Signed)
Left detailed message that cologuard results where negative.

## 2018-01-25 DIAGNOSIS — K469 Unspecified abdominal hernia without obstruction or gangrene: Secondary | ICD-10-CM | POA: Diagnosis not present

## 2018-01-25 DIAGNOSIS — R279 Unspecified lack of coordination: Secondary | ICD-10-CM | POA: Diagnosis not present

## 2018-01-25 DIAGNOSIS — N8189 Other female genital prolapse: Secondary | ICD-10-CM | POA: Diagnosis not present

## 2018-02-02 DIAGNOSIS — S61217A Laceration without foreign body of left little finger without damage to nail, initial encounter: Secondary | ICD-10-CM | POA: Diagnosis not present

## 2018-02-08 ENCOUNTER — Ambulatory Visit: Payer: Self-pay

## 2018-02-08 NOTE — Telephone Encounter (Signed)
Returned call to pt.  Reported she cut the nail and cut of tip of her left 5th finger last week.  Seen in ER and rec'd wound care,  Tetanus shot, and an antibiotic.  Denied further bleeding from initial injury.  Stated "the tip of the finger looks raw and ugly."  Stated she has been cleaning with a q-tip moistened with water, and applying Neosporin oint., and covering with gauze bandage.  Also has a metal finger guard that was recommended by the ER doctor.  Denied any swelling, redness, or drainage from wound.  Denied fever/ chills.  Stated she has a f/u appt. Tomorrow at PCP office, to have this checked.  Is requesting wound care guidelines until she is seen.  Advised to use a clean wash cloth each time she does her wound care; instructed to squeeze soapy solution, with antibacterial soap, onto the wound, rinse well with lukewarm water, apply Neosporin oint. and a gauze bandage at least daily.  Advised if has visible drainage on bandage in between cleaning, then to repeat the wound care.  Advised to use Aseptic technique (explained this to pt.)  Verb. Understanding.            Reason for Disposition . [1] After 3 days AND [2] pain not improved    C/o intermittent throbbing of the left 5th finger; using Tylenol.  Pain is improving.  Has ER f/u appt. Tomorrow.  Wound care recommendations requested until is seen in office.  Answer Assessment - Initial Assessment Questions 1. MECHANISM: "How did the injury happen?"      Cutting potatoes; cut with a knife 2. ONSET: "When did the injury happen?" (Minutes or hours ago)      Tuesday, 11/26 3. LOCATION: "What part of the finger is injured?" "Is the nail damaged?"      Tip of left 5th finger 4. APPEARANCE of the INJURY: "What does the injury look like?"      Looks raw 5. SEVERITY: "Can you use the hand normally?"  "Can you bend your fingers into a ball and then fully open them?"    Yes 6. SIZE: For cuts, bruises, or swelling, ask: "How large is it?" (e.g.,  inches or centimeters;  entire finger)      Tip of left fifth finger cut off  7. PAIN: "Is there pain?" If so, ask: "How bad is the pain?"    (e.g., Scale 1-10; or mild, moderate, severe)     Throbbing; taking Tylenol  8. TETANUS: For any breaks in the skin, ask: "When was the last tetanus booster?"     rec'd Tetanus in the ER 9. OTHER SYMPTOMS: "Do you have any other symptoms?"     Denied swelling, denied fever/ chills, denied redness or drainage; denied further bleeding.  10. PREGNANCY: "Is there any chance you are pregnant?" "When was your last menstrual period?"       N/a  Protocols used: FINGER INJURY-A-AH  Message from R[place]ford Nail, [place] sent at 02/08/2018 9:46 AM EST   Summary: tip of finger cut off over holiday/ cleaning and bandaging concerns   Patient calling and states that she cut the tip of her pinky finger off while out of town over the holiday. States that she went to the ER and was giving a tetanus shot and started on antibiotics. States that she kept the bandage on until 2 days ago and then she took it off and cleaned it very well, but is not sure how she  should clean/bandage it up. Would like to know how she needs to clean and/or bandage the wound? Please advise. CB#: [phone]

## 2018-02-08 NOTE — Telephone Encounter (Signed)
FYI. Appt tomorrow.

## 2018-02-08 NOTE — Telephone Encounter (Signed)
Noted, thx.

## 2018-02-09 ENCOUNTER — Encounter: Payer: Self-pay | Admitting: Internal Medicine

## 2018-02-09 ENCOUNTER — Ambulatory Visit (INDEPENDENT_AMBULATORY_CARE_PROVIDER_SITE_OTHER): Payer: Medicare Other | Admitting: Internal Medicine

## 2018-02-09 VITALS — BP 126/68 | HR 68 | Temp 98.2°F | Resp 16 | Ht 62.0 in | Wt 154.1 lb

## 2018-02-09 DIAGNOSIS — S6990XD Unspecified injury of unspecified wrist, hand and finger(s), subsequent encounter: Secondary | ICD-10-CM | POA: Diagnosis not present

## 2018-02-09 DIAGNOSIS — M67439 Ganglion, unspecified wrist: Secondary | ICD-10-CM

## 2018-02-09 NOTE — Progress Notes (Signed)
Subjective:    Patient ID: Margaret Foster, female    DOB: 03-15-1950, 67 y.o.   MRN: [id number]  DOS:  02/09/2018 Type of visit - description : Acute visit  A week ago she injured the tip of the left fifth finger while she was peeling a potato. She went to urgent care, bleeding was stopped,area  was cauterized. She got a tetanus shot , was prescribed Keflex. Here for follow-up. Since then, she has open the covers several times and finger seems to be healing well.  Also, 2 weeks ago noted a knot on the right wrist, not painful.  Review of Systems  Denies fever chills Pain is not an issue No swelling or discharge  Past Medical History:  Diagnosis Date  . Chicken pox   . GERD (gastroesophageal reflux disease)   . Heart murmur   . Osteopenia   . Skin cancer (melanoma) ([facility])    Arm  . Skin cancer, basal cell    Face, Shoulder, Back    Past Surgical History:  Procedure Laterality Date  . ABDOMINAL HYSTERECTOMY     Still has Ovaries  . BLADDER SUSPENSION     [place]---- dr Dewayne Hatch  . COLONOSCOPY    . SKIN CANCER EXCISION     x's 6 (Multiple sites)    Social History   Socioeconomic History  . Marital status: Married    Spouse name: Not on file  . Number of children: Not on file  . Years of education: Not on file  . Highest education level: Not on file  Occupational History    Employer: [employer]    Comment: retired  Scientific laboratory technician  . Financial resource strain: Not on file  . Food insecurity:    Worry: Not on file    Inability: Not on file  . Transportation needs:    Medical: Not on file    Non-medical: Not on file  Tobacco Use  . Smoking status: Never Smoker  . Smokeless tobacco: Never Used  Substance and Sexual Activity  . Alcohol use: Yes    Alcohol/week: 0.0 standard drinks    Comment: Socially  . Drug use: No  . Sexual activity: Yes    Partners: Male  Lifestyle  . Physical activity:    Days per week: Not on file    Minutes per session: Not  on file  . Stress: Not on file  Relationships  . Social connections:    Talks on phone: Not on file    Gets together: Not on file    Attends religious service: Not on file    Active member of club or organization: Not on file    Attends meetings of clubs or organizations: Not on file    Relationship status: Not on file  . Intimate partner violence:    Fear of current or ex partner: Not on file    Emotionally abused: Not on file    Physically abused: Not on file    Forced sexual activity: Not on file  Other Topics Concern  . Not on file  Social History Narrative   Exercise 4x a week      Allergies as of 02/09/2018   No Known Allergies     Medication List        Accurate as of 02/09/18  2:02 PM. Always use your most recent med list.          aspirin 325 MG tablet Take 325 mg by mouth daily.  cephALEXin 500 MG capsule Commonly known as:  KEFLEX Take 1 capsule by mouth 2 (two) times daily.   FLAX SEEDS PO Take by mouth. Take 2 gel capsules by mouth daily.   meclizine 25 MG tablet Commonly known as:  ANTIVERT Take 1 tablet (25 mg total) by mouth 3 (three) times daily as needed for dizziness.   multivitamin capsule Take 1 capsule by mouth daily.   Vitamin D (Ergocalciferol) 1.25 MG (50000 UT) Caps capsule Commonly known as:  DRISDOL Take 1 capsule (50,000 Units total) by mouth every 7 (seven) days.           Objective:   Physical Exam  Musculoskeletal:       Arms:  BP 126/68 (BP Location: Right Arm, Patient Position: Sitting, Cuff Size: Small)   Pulse 68   Temp 98.2 F (36.8 C) (Oral)   Resp 16   Ht 5\' 2"  (1.575 m)   Wt 154 lb 2 oz (69.9 kg)   SpO2 97%   BMI 28.19 kg/m   General:   Well developed, NAD, BMI noted. HEENT:  Normocephalic . Face symmetric, atraumatic  Left fifth finger: See picture.  Wound is not tender, good capillary refill throughout the finger. Skin: Not pale. Not jaundice Neurologic:  alert & oriented X3.  Speech normal,  gait appropriate for age and unassisted Psych--  Cognition and judgment appear intact.  Cooperative with normal attention span and concentration.  Behavior appropriate. No anxious or depressed appearing.        Assessment & Plan:    67 year old female, no major medical problems, presents with Injury, left fifth finger: Seems to be healing well, recommend to keep it clean and dry, see AVS.  Call if not gradually improving. Cyst, right wrist: Findings most consistent with a synovial cyst.  Recommend observation for now, discussed with PCP on RTC.  Call if pain or get worse.

## 2018-02-09 NOTE — Progress Notes (Signed)
Pre visit review using our clinic review tool, if applicable. No additional management support is needed unless otherwise documented below in the visit note. 

## 2018-02-09 NOTE — Patient Instructions (Signed)
Keep the area clean and dry  Cover with antibiotic creams  Keep your core with a Band-Aid until the healing advance more  Ok to take showers  Come back in 2 to 3 weeks if you are not getting gradually better  Call if signs of infection

## 2018-02-11 DIAGNOSIS — N8189 Other female genital prolapse: Secondary | ICD-10-CM | POA: Diagnosis not present

## 2018-02-11 DIAGNOSIS — K469 Unspecified abdominal hernia without obstruction or gangrene: Secondary | ICD-10-CM | POA: Diagnosis not present

## 2018-02-11 DIAGNOSIS — R279 Unspecified lack of coordination: Secondary | ICD-10-CM | POA: Diagnosis not present

## 2018-02-14 ENCOUNTER — Other Ambulatory Visit: Payer: Self-pay | Admitting: Family Medicine

## 2018-02-16 DIAGNOSIS — R279 Unspecified lack of coordination: Secondary | ICD-10-CM | POA: Diagnosis not present

## 2018-02-16 DIAGNOSIS — K469 Unspecified abdominal hernia without obstruction or gangrene: Secondary | ICD-10-CM | POA: Diagnosis not present

## 2018-02-16 DIAGNOSIS — N8189 Other female genital prolapse: Secondary | ICD-10-CM | POA: Diagnosis not present

## 2018-12-06 ENCOUNTER — Other Ambulatory Visit: Payer: Self-pay

## 2018-12-06 ENCOUNTER — Encounter: Payer: Self-pay | Admitting: Internal Medicine

## 2018-12-06 ENCOUNTER — Ambulatory Visit (INDEPENDENT_AMBULATORY_CARE_PROVIDER_SITE_OTHER): Payer: Medicare Other | Admitting: Internal Medicine

## 2018-12-06 VITALS — BP 142/68 | HR 74 | Temp 97.1°F | Resp 16 | Ht 62.0 in | Wt 161.4 lb

## 2018-12-06 DIAGNOSIS — R399 Unspecified symptoms and signs involving the genitourinary system: Secondary | ICD-10-CM | POA: Diagnosis not present

## 2018-12-06 LAB — POC URINALSYSI DIPSTICK (AUTOMATED)
Bilirubin, UA: NEGATIVE
Blood, UA: NEGATIVE
Glucose, UA: NEGATIVE
Ketones, UA: NEGATIVE
Leukocytes, UA: NEGATIVE
Nitrite, UA: NEGATIVE
Protein, UA: NEGATIVE
Spec Grav, UA: 1.01 (ref 1.010–1.025)
Urobilinogen, UA: 0.2 E.U./dL
pH, UA: 6 (ref 5.0–8.0)

## 2018-12-06 MED ORDER — NITROFURANTOIN MONOHYD MACRO 100 MG PO CAPS: 100.0000 mg | ORAL_CAPSULE | Freq: Two times a day (BID) | ORAL | 0 refills | Status: DC

## 2018-12-06 NOTE — Progress Notes (Signed)
Pre visit review using our clinic review tool, if applicable. No additional management support is needed unless otherwise documented below in the visit note. 

## 2018-12-06 NOTE — Patient Instructions (Signed)
Drink plenty fluids  If your symptoms increase a little go ahead and start the antibiotic otherwise will wait for the culture and let you know  If you get worse, please let us know.

## 2018-12-06 NOTE — Progress Notes (Signed)
Subjective:    Patient ID: Margaret Foster, female    DOB: 14-Jun-1950, 67 y.o.   MRN: [id number]  DOS:  12/06/2018 Type of visit - description: Acute Symptoms of started yesterday with mild dysuria. She is leaving town tomorrow and is concerned about a UTI, she had previous UTIs before.  Review of Systems  Denies fever chills No nausea or vomiting No suprapubic or flank discomfort No vaginal discharge, bleeding or a rash.  Past Medical History:  Diagnosis Date  . Chicken pox   . GERD (gastroesophageal reflux disease)   . Heart murmur   . Osteopenia   . Skin cancer (melanoma) (HCC)    Arm  . Skin cancer, basal cell    Face, Shoulder, Back    Past Surgical History:  Procedure Laterality Date  . ABDOMINAL HYSTERECTOMY     Still has Ovaries  . BLADDER SUSPENSION     [place]---- dr Dewayne Hatch  . COLONOSCOPY    . SKIN CANCER EXCISION     x's 6 (Multiple sites)    Social History   Socioeconomic History  . Marital status: Married    Spouse name: Not on file  . Number of children: Not on file  . Years of education: Not on file  . Highest education level: Not on file  Occupational History    Employer: [employer]    Comment: retired  Scientific laboratory technician  . Financial resource strain: Not on file  . Food insecurity    Worry: Not on file    Inability: Not on file  . Transportation needs    Medical: Not on file    Non-medical: Not on file  Tobacco Use  . Smoking status: Never Smoker  . Smokeless tobacco: Never Used  Substance and Sexual Activity  . Alcohol use: Yes    Alcohol/week: 0.0 standard drinks    Comment: Socially  . Drug use: No  . Sexual activity: Yes    Partners: Male  Lifestyle  . Physical activity    Days per week: Not on file    Minutes per session: Not on file  . Stress: Not on file  Relationships  . Social Herbalist on phone: Not on file    Gets together: Not on file    Attends religious service: Not on file    Active member of club  or organization: Not on file    Attends meetings of clubs or organizations: Not on file    Relationship status: Not on file  . Intimate partner violence    Fear of current or ex partner: Not on file    Emotionally abused: Not on file    Physically abused: Not on file    Forced sexual activity: Not on file  Other Topics Concern  . Not on file  Social History Narrative   Exercise 4x a week      Allergies as of 12/06/2018   No Known Allergies     Medication List       Accurate as of December 06, 2018 11:59 PM. If you have any questions, ask your nurse or doctor.        STOP taking these medications   cephALEXin 500 MG capsule Commonly known as: KEFLEX Stopped by: Kathlene November, MD   Vitamin D (Ergocalciferol) 1.25 MG (50000 UT) Caps capsule Commonly known as: DRISDOL Stopped by: Kathlene November, MD     TAKE these medications   aspirin 325 MG tablet Take 325 mg  by mouth daily.   FLAX SEEDS PO Take by mouth. Take 2 gel capsules by mouth daily.   meclizine 25 MG tablet Commonly known as: ANTIVERT Take 1 tablet (25 mg total) by mouth 3 (three) times daily as needed for dizziness.   multivitamin capsule Take 1 capsule by mouth daily.   nitrofurantoin (macrocrystal-monohydrate) 100 MG capsule Commonly known as: MACROBID Take 1 capsule (100 mg total) by mouth 2 (two) times daily. Started by: Kathlene November, MD           Objective:   Physical Exam BP (!) 142/68 (BP Location: Left Arm, Patient Position: Sitting, Cuff Size: Small)   Pulse 74   Temp (!) 97.1 F (36.2 C) (Temporal)   Resp 16   Ht 5\' 2"  (1.575 m)   Wt 161 lb 6 oz (73.2 kg)   SpO2 96%   BMI 29.52 kg/m  General:   Well developed, NAD, BMI noted.  HEENT:  Normocephalic . Face symmetric, atraumatic abdomen:  Not distended, soft, non-tender. No rebound or rigidity.  No CVA tenderness Skin: Not pale. Not jaundice Neurologic:  alert & oriented X3.  Speech normal, gait appropriate for age and unassisted  Psych--  Cognition and judgment appear intact.  Cooperative with normal attention span and concentration.  Behavior appropriate. No anxious or depressed appearing.     Assessment      68 year old female, no major medical problems, presents with   UTI?  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 Plan: Check a UA, urine culture. Also I provided a printed prescription for Macrobid, to self restart if symptoms increase before the culture is ready. Drink plenty of fluids

## 2018-12-07 LAB — URINALYSIS, ROUTINE W REFLEX MICROSCOPIC
Bilirubin Urine: NEGATIVE
Hgb urine dipstick: NEGATIVE
Ketones, ur: NEGATIVE
Leukocytes,Ua: NEGATIVE
Nitrite: NEGATIVE
RBC / HPF: NONE SEEN (ref 0–?)
Specific Gravity, Urine: <=1.005 — AB (ref 1.000–1.030)
Total Protein, Urine: NEGATIVE
Urine Glucose: NEGATIVE
Urobilinogen, UA: 0.2 (ref 0.0–1.0)
pH: 5.5 (ref 5.0–8.0)

## 2018-12-08 LAB — URINE CULTURE
MICRO NUMBER:: 928765
SPECIMEN QUALITY:: ADEQUATE

## 2018-12-09 ENCOUNTER — Telehealth: Payer: Self-pay

## 2018-12-09 NOTE — Telephone Encounter (Signed)
Copied from [place] [id number]. Topic: General - Inquiry >> Dec 09, 2018 11:26 AM Margaret Foster wrote: Reason for CRM: Patient would like to speak with PCP or nurse regarding her uti.  Patient is still having symptoms and would like to speak to nurse if she does need to start taking her antibiotics.   Call back [phone]

## 2018-12-09 NOTE — Telephone Encounter (Signed)
Patient could not access mychart on review her results. Still having dysuria, no fever, occasional chills No nausea, vomiting, diarrhea.  No vaginal discharge or bleeding. Plan: Start antibiotics prescribed to her at the time of the visit.

## 2018-12-09 NOTE — Telephone Encounter (Signed)
Please advise 

## 2019-04-15 DIAGNOSIS — Z23 Encounter for immunization: Secondary | ICD-10-CM | POA: Diagnosis not present

## 2019-06-16 ENCOUNTER — Other Ambulatory Visit: Payer: Self-pay

## 2019-06-17 ENCOUNTER — Encounter: Payer: Self-pay | Admitting: Family Medicine

## 2019-06-17 ENCOUNTER — Ambulatory Visit (INDEPENDENT_AMBULATORY_CARE_PROVIDER_SITE_OTHER): Payer: Medicare Other | Admitting: Family Medicine

## 2019-06-17 ENCOUNTER — Other Ambulatory Visit: Payer: Self-pay

## 2019-06-17 VITALS — BP 120/84 | HR 78 | Temp 98.0°F | Resp 18 | Ht 62.0 in | Wt 160.2 lb

## 2019-06-17 DIAGNOSIS — Z136 Encounter for screening for cardiovascular disorders: Secondary | ICD-10-CM

## 2019-06-17 DIAGNOSIS — E559 Vitamin D deficiency, unspecified: Secondary | ICD-10-CM

## 2019-06-17 DIAGNOSIS — Z1231 Encounter for screening mammogram for malignant neoplasm of breast: Secondary | ICD-10-CM

## 2019-06-17 DIAGNOSIS — Z85828 Personal history of other malignant neoplasm of skin: Secondary | ICD-10-CM

## 2019-06-17 DIAGNOSIS — E2839 Other primary ovarian failure: Secondary | ICD-10-CM | POA: Diagnosis not present

## 2019-06-17 DIAGNOSIS — Z0000 Encounter for general adult medical examination without abnormal findings: Secondary | ICD-10-CM | POA: Diagnosis not present

## 2019-06-17 LAB — COMPREHENSIVE METABOLIC PANEL
ALT: 17 U/L (ref 0–35)
AST: 17 U/L (ref 0–37)
Albumin: 4.4 g/dL (ref 3.5–5.2)
Alkaline Phosphatase: 62 U/L (ref 39–117)
BUN: 11 mg/dL (ref 6–23)
CO2: 27 mEq/L (ref 19–32)
Calcium: 9.2 mg/dL (ref 8.4–10.5)
Chloride: 104 mEq/L (ref 96–112)
Creatinine, Ser: 0.71 mg/dL (ref 0.40–1.20)
GFR: 81.3 mL/min (ref >60.00)
Glucose, Bld: 103 mg/dL — ABNORMAL HIGH (ref 70–99)
Potassium: 4 mEq/L (ref 3.5–5.1)
Sodium: 139 mEq/L (ref 135–145)
Total Bilirubin: 0.7 mg/dL (ref 0.2–1.2)
Total Protein: 6.9 g/dL (ref 6.0–8.3)

## 2019-06-17 LAB — LIPID PANEL
Cholesterol: 277 mg/dL — ABNORMAL HIGH (ref 0–200)
HDL: 87.4 mg/dL (ref >39.00)
LDL Cholesterol: 165 mg/dL — ABNORMAL HIGH (ref 0–99)
NonHDL: 189.61
Total CHOL/HDL Ratio: 3
Triglycerides: 123 mg/dL (ref 0.0–149.0)
VLDL: 24.6 mg/dL (ref 0.0–40.0)

## 2019-06-17 LAB — VITAMIN D 25 HYDROXY (VIT D DEFICIENCY, FRACTURES): VITD: 34 ng/mL (ref 30.00–100.00)

## 2019-06-17 NOTE — Patient Instructions (Signed)
Preventive Care 38 Years and Older, Female Preventive care refers to lifestyle choices and visits with your health care provider that can promote health and wellness. This includes:  A yearly physical exam. This is also called an annual well check.  Regular dental and eye exams.  Immunizations.  Screening for certain conditions.  Healthy lifestyle choices, such as diet and exercise. What can I expect for my preventive care visit? Physical exam Your health care provider will check:  Height and weight. These may be used to calculate body mass index (BMI), which is a measurement that tells if you are at a healthy weight.  Heart rate and blood pressure.  Your skin for abnormal spots. Counseling Your health care provider may ask you questions about:  Alcohol, tobacco, and drug use.  Emotional well-being.  Home and relationship well-being.  Sexual activity.  Eating habits.  History of falls.  Memory and ability to understand (cognition).  Work and work Statistician.  Pregnancy and menstrual history. What immunizations do I need?  Influenza (flu) vaccine  This is recommended every year. Tetanus, diphtheria, and pertussis (Tdap) vaccine  You may need a Td booster every 10 years. Varicella (chickenpox) vaccine  You may need this vaccine if you have not already been vaccinated. Zoster (shingles) vaccine  You may need this after age 33. Pneumococcal conjugate (PCV13) vaccine  One dose is recommended after age 33. Pneumococcal polysaccharide (PPSV23) vaccine  One dose is recommended after age 72. Measles, mumps, and rubella (MMR) vaccine  You may need at least one dose of MMR if you were born in 1957 or later. You may also need a second dose. Meningococcal conjugate (MenACWY) vaccine  You may need this if you have certain conditions. Hepatitis A vaccine  You may need this if you have certain conditions or if you travel or work in places where you may be exposed  to hepatitis A. Hepatitis B vaccine  You may need this if you have certain conditions or if you travel or work in places where you may be exposed to hepatitis B. Haemophilus influenzae type b (Hib) vaccine  You may need this if you have certain conditions. You may receive vaccines as individual doses or as more than one vaccine together in one shot (combination vaccines). Talk with your health care provider about the risks and benefits of combination vaccines. What tests do I need? Blood tests  Lipid and cholesterol levels. These may be checked every 5 years, or more frequently depending on your overall health.  Hepatitis C test.  Hepatitis B test. Screening  Lung cancer screening. You may have this screening every year starting at age 39 if you have a 30-pack-year history of smoking and currently smoke or have quit within the past 15 years.  Colorectal cancer screening. All adults should have this screening starting at age 36 and continuing until age 15. Your health care provider may recommend screening at age 23 if you are at increased risk. You will have tests every 1-10 years, depending on your results and the type of screening test.  Diabetes screening. This is done by checking your blood sugar (glucose) after you have not eaten for a while (fasting). You may have this done every 1-3 years.  Mammogram. This may be done every 1-2 years. Talk with your health care provider about how often you should have regular mammograms.  BRCA-related cancer screening. This may be done if you have a family history of breast, ovarian, tubal, or peritoneal cancers.  Other tests  Sexually transmitted disease (STD) testing.  Bone density scan. This is done to screen for osteoporosis. You may have this done starting at age 44. Follow these instructions at home: Eating and drinking  Eat a diet that includes fresh fruits and vegetables, whole grains, lean protein, and low-fat dairy products. Limit  your intake of foods with high amounts of sugar, saturated fats, and salt.  Take vitamin and mineral supplements as recommended by your health care provider.  Do not drink alcohol if your health care provider tells you not to drink.  If you drink alcohol: ? Limit how much you have to 0-1 drink a day. ? Be aware of how much alcohol is in your drink. In the [place] one drink equals one 12 oz bottle of beer (355 mL), one 5 oz glass of wine (148 mL), or one 1 oz glass of hard liquor (44 mL). Lifestyle  Take daily care of your teeth and gums.  Stay active. Exercise for at least 30 minutes on 5 or more days each week.  Do not use any products that contain nicotine or tobacco, such as cigarettes, e-cigarettes, and chewing tobacco. If you need help quitting, ask your health care provider.  If you are sexually active, practice safe sex. Use a condom or other form of protection in order to prevent STIs (sexually transmitted infections).  Talk with your health care provider about taking a low-dose aspirin or statin. What's next?  Go to your health care provider once a year for a well check visit.  Ask your health care provider how often you should have your eyes and teeth checked.  Stay up to date on all vaccines. This information is not intended to replace advice given to you by your health care provider. Make sure you discuss any questions you have with your health care provider. Document Revised: 02/18/2018 Document Reviewed: 02/18/2018 Elsevier Patient Education  2020 [employer].

## 2019-06-17 NOTE — Progress Notes (Signed)
Subjective:     Margaret Foster is a 69 y.o. female and is here for a comprehensive physical exam. The patient reports no problems.  Social History   Socioeconomic History  . Marital status: Married    Spouse name: Not on file  . Number of children: Not on file  . Years of education: Not on file  . Highest education level: Not on file  Occupational History    Employer: [employer]    Comment: retired  Tobacco Use  . Smoking status: Never Smoker  . Smokeless tobacco: Never Used  Substance and Sexual Activity  . Alcohol use: Yes    Alcohol/week: 0.0 standard drinks    Comment: Socially  . Drug use: No  . Sexual activity: Yes    Partners: Male  Other Topics Concern  . Not on file  Social History Narrative   Exercise 4x a week   Social Determinants of Health   Financial Resource Strain:   . Difficulty of Paying Living Expenses:   Food Insecurity:   . Worried About Charity fundraiser in the Last Year:   . Arboriculturist in the Last Year:   Transportation Needs:   . Film/video editor (Medical):   Marland Kitchen Lack of Transportation (Non-Medical):   Physical Activity:   . Days of Exercise per Week:   . Minutes of Exercise per Session:   Stress:   . Feeling of Stress :   Social Connections:   . Frequency of Communication with Friends and Family:   . Frequency of Social Gatherings with Friends and Family:   . Attends Religious Services:   . Active Member of Clubs or Organizations:   . Attends Archivist Meetings:   Marland Kitchen Marital Status:   Intimate Partner Violence:   . Fear of Current or Ex-Partner:   . Emotionally Abused:   Marland Kitchen Physically Abused:   . Sexually Abused:    Health Maintenance  Topic Date Due  . PNA vac Low Risk Adult (1 of 2 - PCV13) Never done  . MAMMOGRAM  07/30/2018  . INFLUENZA VACCINE  10/09/2019  . DEXA SCAN  11/04/2019  . Fecal DNA (Cologuard)  10/22/2020  . TETANUS/TDAP  02/03/2028  . Hepatitis C Screening  Completed    The following  portions of the patient's history were reviewed and updated as appropriate:  She  has a past medical history of Chicken pox, GERD (gastroesophageal reflux disease), Heart murmur, Osteopenia, Skin cancer (melanoma) ([facility]), and Skin cancer, basal cell. She does not have any pertinent problems on file. She  has a past surgical history that includes Skin cancer excision; Abdominal hysterectomy; Colonoscopy; and Bladder suspension. Her family history includes Alzheimer's disease in her mother; Arthritis in her mother; Diabetes in her brother; Kidney disease in her mother; Stomach cancer in her maternal uncle. She  reports that she has never smoked. She has never used smokeless tobacco. She reports current alcohol use. She reports that she does not use drugs. She has a current medication list which includes the following prescription(s): aspirin and multivitamin. Current Outpatient Medications on File Prior to Visit  Medication Sig Dispense Refill  . aspirin 325 MG tablet Take 325 mg by mouth daily.    . Multiple Vitamin (MULTIVITAMIN) capsule Take 1 capsule by mouth daily.     No current facility-administered medications on file prior to visit.   She has No Known Allergies..  Review of Systems  Review of Systems  Constitutional: Negative for  activity change, appetite change and fatigue.  HENT: Negative for hearing loss, congestion, tinnitus and ear discharge.   Eyes: Negative for visual disturbance (see optho q1y -- vision corrected to 20/20 with glasses).  Respiratory: Negative for cough, chest tightness and shortness of breath.   Cardiovascular: Negative for chest pain, palpitations and leg swelling.  Gastrointestinal: Negative for abdominal pain, diarrhea, constipation and abdominal distention.  Genitourinary: Negative for urgency, frequency, decreased urine volume and difficulty urinating.  Musculoskeletal: Negative for back pain, arthralgias and gait problem.  Skin: Negative for color  change, pallor and rash.  Neurological: Negative for dizziness, light-headedness, numbness and headaches.  Hematological: Negative for adenopathy. Does not bruise/bleed easily.  Psychiatric/Behavioral: Negative for suicidal ideas, confusion, sleep disturbance, self-injury, dysphoric mood, decreased concentration and agitation.  Pt is able to read and write and can do all ADLs No risk for falling No abuse/ violence in home    Objective:    BP 120/84 (BP Location: Right Arm, Patient Position: Sitting, Cuff Size: Normal)   Pulse 78   Temp 98 F (36.7 C) (Temporal)   Resp 18   Ht 5\' 2"  (1.575 m)   Wt 160 lb 3.2 oz (72.7 kg)   SpO2 98%   BMI 29.30 kg/m  General appearance: alert, cooperative, appears stated age and no distress Head: Normocephalic, without obvious abnormality, atraumatic Eyes: conjunctivae/corneas clear. PERRL, EOM's intact. Fundi benign. Ears: normal TM's and external ear canals both ears Neck: no adenopathy, no carotid bruit, no JVD, supple, symmetrical, trachea midline and thyroid not enlarged, symmetric, no tenderness/mass/nodules Back: symmetric, no curvature. ROM normal. No CVA tenderness. Lungs: clear to auscultation bilaterally Breasts: normal appearance, no masses or tenderness Heart: regular rate and rhythm, S1, S2 normal, no murmur, click, rub or gallop Abdomen: soft, non-tender; bowel sounds normal; no masses,  no organomegaly Pelvic: not indicated; status post hysterectomy, negative ROS Extremities: extremities normal, atraumatic, no cyanosis or edema Pulses: 2+ and symmetric Skin: Skin color, texture, turgor normal. No rashes or lesions Lymph nodes: Cervical, supraclavicular, and axillary nodes normal. Neurologic: Alert and oriented X 3, normal strength and tone. Normal symmetric reflexes. Normal coordination and gait    Assessment:    Healthy female exam.      Plan:    ghm utd Check labs See After Visit Summary for Counseling Recommendations     1. Estrogen deficiency  - DG Bone Density; Future  2. History of skin cancer  - Ambulatory referral to Dermatology  3. Ischemic heart disease screen  - Lipid panel - Comprehensive metabolic panel  4. Vitamin D deficiency  - Vitamin D (25 hydroxy)  5. Preventative health care above

## 2019-06-20 ENCOUNTER — Other Ambulatory Visit: Payer: Self-pay | Admitting: Family Medicine

## 2019-06-20 DIAGNOSIS — Z1231 Encounter for screening mammogram for malignant neoplasm of breast: Secondary | ICD-10-CM

## 2019-06-29 ENCOUNTER — Ambulatory Visit (INDEPENDENT_AMBULATORY_CARE_PROVIDER_SITE_OTHER): Payer: Medicare Other

## 2019-06-29 ENCOUNTER — Other Ambulatory Visit: Payer: Self-pay

## 2019-06-29 DIAGNOSIS — E2839 Other primary ovarian failure: Secondary | ICD-10-CM | POA: Diagnosis not present

## 2019-06-29 DIAGNOSIS — Z1231 Encounter for screening mammogram for malignant neoplasm of breast: Secondary | ICD-10-CM | POA: Diagnosis not present

## 2019-06-29 DIAGNOSIS — M85851 Other specified disorders of bone density and structure, right thigh: Secondary | ICD-10-CM | POA: Diagnosis not present

## 2019-07-12 DIAGNOSIS — L578 Other skin changes due to chronic exposure to nonionizing radiation: Secondary | ICD-10-CM | POA: Diagnosis not present

## 2019-07-12 DIAGNOSIS — D1801 Hemangioma of skin and subcutaneous tissue: Secondary | ICD-10-CM | POA: Diagnosis not present

## 2019-07-12 DIAGNOSIS — Z85828 Personal history of other malignant neoplasm of skin: Secondary | ICD-10-CM | POA: Diagnosis not present

## 2019-07-12 DIAGNOSIS — L821 Other seborrheic keratosis: Secondary | ICD-10-CM | POA: Diagnosis not present

## 2019-07-12 DIAGNOSIS — L814 Other melanin hyperpigmentation: Secondary | ICD-10-CM | POA: Diagnosis not present

## 2019-07-18 ENCOUNTER — Ambulatory Visit (INDEPENDENT_AMBULATORY_CARE_PROVIDER_SITE_OTHER): Payer: Medicare Other | Admitting: Family Medicine

## 2019-07-18 ENCOUNTER — Ambulatory Visit (HOSPITAL_BASED_OUTPATIENT_CLINIC_OR_DEPARTMENT_OTHER)
Admission: RE | Admit: 2019-07-18 | Discharge: 2019-07-18 | Disposition: A | Discharge status: Home / Self Care | Payer: Medicare Other | Source: Ambulatory Visit | Attending: Family Medicine | Admitting: Family Medicine

## 2019-07-18 ENCOUNTER — Other Ambulatory Visit: Payer: Self-pay

## 2019-07-18 ENCOUNTER — Encounter: Payer: Self-pay | Admitting: Family Medicine

## 2019-07-18 VITALS — BP 128/78 | HR 78 | Temp 96.2°F | Ht 62.0 in | Wt 152.5 lb

## 2019-07-18 DIAGNOSIS — M79672 Pain in left foot: Secondary | ICD-10-CM

## 2019-07-18 DIAGNOSIS — S92512A Displaced fracture of proximal phalanx of left lesser toe(s), initial encounter for closed fracture: Secondary | ICD-10-CM | POA: Diagnosis not present

## 2019-07-18 DIAGNOSIS — S92515A Nondisplaced fracture of proximal phalanx of left lesser toe(s), initial encounter for closed fracture: Secondary | ICD-10-CM

## 2019-07-18 NOTE — Progress Notes (Signed)
Musculoskeletal Exam  Patient: Margaret Foster DOB: 09-06-1950  DOS: 07/18/2019  SUBJECTIVE:  Chief Complaint:   Chief Complaint  Patient presents with  . Foot Injury    swelling    Margaret Foster is a 69 y.o.  female for evaluation and treatment of L foot pain.   Onset:  3 days ago. Slammed foot into a wooden baseboard trying to put foot on a stool.  Location: L foot Progression of issue:  is unchanged Associated symptoms: swelling, it bruised initially; no redness Treatment: to date has been rest, ice, acetaminophen and elevated.   Neurovascular symptoms: no  Past Medical History:  Diagnosis Date  . Chicken pox   . GERD (gastroesophageal reflux disease)   . Heart murmur   . Osteopenia   . Skin cancer (melanoma) (HCC)    Arm  . Skin cancer, basal cell    Face, Shoulder, Back    Objective: VITAL SIGNS: BP 128/78 (BP Location: Right Arm, Patient Position: Sitting, Cuff Size: Normal)   Pulse 78   Temp (!) 96.2 F (35.7 C) (Temporal)   Ht 5\' 2"  (1.575 m)   Wt 152 lb 8 oz (69.2 kg)   SpO2 93%   BMI 27.89 kg/m  Constitutional: Well formed, well developed. No acute distress. Cardiovascular: Brisk cap refill Thorax & Lungs: No accessory muscle use Musculoskeletal: Left foot.   Tenderness to palpation: yes, over the lateral proximal phalanx of the fifth digit Deformity: no Ecchymosis: no Neurologic: Normal sensory function. Psychiatric: Normal mood. Age appropriate judgment and insight. Alert & oriented x 3.    Assessment:  Closed nondisplaced fracture of proximal phalanx of lesser toe of left foot, initial encounter - Plan: Ambulatory referral to Sports Medicine  Left foot pain - Plan: DG Toe 5th Left  Plan: X-ray unofficially shows a nondisplaced distally located on the proximal phalanx fracture, left foot: Await final read.  Flat soled shoe provided today.  Ice, Tylenol, activity as tolerated.  Refer to the sports medicine team for further  evaluation/management. F/u as needed with me. The patient voiced understanding and agreement to the plan.   [place], DO 07/18/19  3:20 PM

## 2019-07-18 NOTE — Patient Instructions (Signed)
Elevate your leg.   If you do not hear anything about your referral in the next 1-2 weeks, call our office and ask for an update.  Wear the flat shoe when you walk as long as you are in pain with ambulation.   Ice/cold pack over area for 10-15 min twice daily.  OK to take Tylenol 1000 mg (2 extra strength tabs) or 975 mg (3 regular strength tabs) every 6 hours as needed.  Let us know if you need anything.

## 2019-07-21 ENCOUNTER — Other Ambulatory Visit: Payer: Self-pay

## 2019-07-21 ENCOUNTER — Ambulatory Visit (INDEPENDENT_AMBULATORY_CARE_PROVIDER_SITE_OTHER): Payer: Medicare Other | Admitting: Family Medicine

## 2019-07-21 ENCOUNTER — Ambulatory Visit (HOSPITAL_BASED_OUTPATIENT_CLINIC_OR_DEPARTMENT_OTHER)
Admission: RE | Admit: 2019-07-21 | Discharge: 2019-07-21 | Disposition: A | Discharge status: Home / Self Care | Payer: Medicare Other | Source: Ambulatory Visit | Attending: Family Medicine | Admitting: Family Medicine

## 2019-07-21 VITALS — BP 138/78 | Ht 62.0 in | Wt 150.0 lb

## 2019-07-21 DIAGNOSIS — S92535A Nondisplaced fracture of distal phalanx of left lesser toe(s), initial encounter for closed fracture: Secondary | ICD-10-CM | POA: Insufficient documentation

## 2019-07-21 DIAGNOSIS — S92512A Displaced fracture of proximal phalanx of left lesser toe(s), initial encounter for closed fracture: Secondary | ICD-10-CM | POA: Diagnosis not present

## 2019-07-21 NOTE — Progress Notes (Signed)
Margaret Foster - 69 y.o. female MRN [id number]  Date of birth: Feb 02, 1951  SUBJECTIVE:  Including CC & ROS.  No chief complaint on file.   Margaret Foster is a 69 y.o. female that is presenting with left pinky toe pain.  She hit the toe on a piece of molding when she was lifting up.  She was seen and had an intra-articular distal phalanx fracture of the fifth digit.  She has had improvement with using shoes as opposed to the postop shoe.  Still having ongoing swelling.  Pain is minimal.  She has been taping..  Independent review of the left fifth toe x-ray from 5/10 shows a comminuted intra-articular fracture of the distal phalanx   Review of Systems See HPI   HISTORY: Past Medical, Surgical, Social, and Family History Reviewed & Updated per EMR.   Pertinent Historical Findings include:  Past Medical History:  Diagnosis Date  . Chicken pox   . GERD (gastroesophageal reflux disease)   . Heart murmur   . Osteopenia   . Skin cancer (melanoma) (HCC)    Arm  . Skin cancer, basal cell    Face, Shoulder, Back    Past Surgical History:  Procedure Laterality Date  . ABDOMINAL HYSTERECTOMY     Still has Ovaries  . BLADDER SUSPENSION     [place]---- dr Dewayne Hatch  . COLONOSCOPY    . SKIN CANCER EXCISION     x's 6 (Multiple sites)    Family History  Problem Relation Age of Onset  . Arthritis Mother   . Alzheimer's disease Mother   . Kidney disease Mother   . Diabetes Brother   . Stomach cancer Maternal Uncle   . Colon cancer Neg Hx   . Colon polyps Neg Hx   . Esophageal cancer Neg Hx   . Rectal cancer Neg Hx     Social History   Socioeconomic History  . Marital status: Married    Spouse name: Not on file  . Number of children: Not on file  . Years of education: Not on file  . Highest education level: Not on file  Occupational History    Employer: [employer]    Comment: retired  Tobacco Use  . Smoking status: Never Smoker  . Smokeless tobacco: Never Used    Substance and Sexual Activity  . Alcohol use: Yes    Alcohol/week: 0.0 standard drinks    Comment: Socially  . Drug use: No  . Sexual activity: Yes    Partners: Male  Other Topics Concern  . Not on file  Social History Narrative   Exercise 4x a week   Social Determinants of Health   Financial Resource Strain:   . Difficulty of Paying Living Expenses:   Food Insecurity:   . Worried About Charity fundraiser in the Last Year:   . Arboriculturist in the Last Year:   Transportation Needs:   . Film/video editor (Medical):   Marland Kitchen Lack of Transportation (Non-Medical):   Physical Activity:   . Days of Exercise per Week:   . Minutes of Exercise per Session:   Stress:   . Feeling of Stress :   Social Connections:   . Frequency of Communication with Friends and Family:   . Frequency of Social Gatherings with Friends and Family:   . Attends Religious Services:   . Active Member of Clubs or Organizations:   . Attends Archivist Meetings:   Marland Kitchen Marital Status:  Intimate Partner Violence:   . Fear of Current or Ex-Partner:   . Emotionally Abused:   Marland Kitchen Physically Abused:   . Sexually Abused:      PHYSICAL EXAM:  VS: BP 138/78   Ht 5\' 2"  (1.575 m)   Wt 150 lb (68 kg)   BMI 27.44 kg/m  Physical Exam Gen: NAD, alert, cooperative with exam, well-appearing MSK:  Left foot: Ecchymosis occurring between the other lesser toes in the interdigit webspace. The fifth phalanx is enlarged and swollen. Tenderness to palpation of the distal phalanx of the fifth digit. Limited flexion extension actively. Neurovascularly intact     ASSESSMENT & PLAN:   Closed nondisplaced fracture of distal phalanx of lesser toe of left foot Initial injury on 5/7.  Does not appear to have other fractures but does have ecchymosis in different toe webs.  Fracture is intra-articular. -Counseled on buddy taping. -X-ray. -Counseled on return to exercise.

## 2019-07-21 NOTE — Assessment & Plan Note (Signed)
Initial injury on 5/7.  Does not appear to have other fractures but does have ecchymosis in different toe webs.  Fracture is intra-articular. -Counseled on buddy taping. -X-ray. -Counseled on return to exercise.

## 2019-07-21 NOTE — Patient Instructions (Signed)
Nice to meet you  Buddy tape for 3-4 weeks  Please use ice and elevate  Please use pain as your guide in returning to exercises  Please send me a message in MyChart with any questions or updates.  Please see [place] back as needed.   --Dr. Raeford Razor

## 2019-07-25 ENCOUNTER — Telehealth: Payer: Self-pay | Admitting: Family Medicine

## 2019-07-25 NOTE — Telephone Encounter (Signed)
Spoke with patient about xrays.   Rosemarie Ax, MD [facility] 07/25/2019, 9:05 AM

## 2019-11-29 ENCOUNTER — Telehealth: Payer: Self-pay

## 2019-11-29 NOTE — Telephone Encounter (Signed)
Spoke with patient yesterday regarding a bill ($325.00) she received from [employer] 06/17/2019 for her yearly visit.  I let patient know I would be forwarding this information on to Billing Leadership and they will be contacting her in regards to the issue.

## 2019-11-30 DIAGNOSIS — Z20822 Contact with and (suspected) exposure to covid-19: Secondary | ICD-10-CM | POA: Diagnosis not present

## 2019-12-05 DIAGNOSIS — Z23 Encounter for immunization: Secondary | ICD-10-CM | POA: Diagnosis not present

## 2020-04-19 DIAGNOSIS — H527 Unspecified disorder of refraction: Secondary | ICD-10-CM | POA: Diagnosis not present

## 2020-04-19 DIAGNOSIS — H25813 Combined forms of age-related cataract, bilateral: Secondary | ICD-10-CM | POA: Diagnosis not present

## 2020-05-04 NOTE — Telephone Encounter (Signed)
Patient calling back in regards to the bill. Patient states the billing board decided she was responsible for the bill.  Patient states she would like to know what took place during the visit that possibly change the coding from a wellness. Patient states medicare always pay for wellness in full.

## 2020-06-18 ENCOUNTER — Ambulatory Visit: Payer: Medicare Other | Admitting: Family Medicine

## 2020-06-26 ENCOUNTER — Encounter: Payer: Self-pay | Admitting: Family Medicine

## 2020-06-26 ENCOUNTER — Ambulatory Visit (INDEPENDENT_AMBULATORY_CARE_PROVIDER_SITE_OTHER): Payer: Medicare Other | Admitting: Family Medicine

## 2020-06-26 ENCOUNTER — Ambulatory Visit: Payer: Medicare Other | Attending: Internal Medicine

## 2020-06-26 ENCOUNTER — Other Ambulatory Visit: Payer: Self-pay

## 2020-06-26 DIAGNOSIS — Z136 Encounter for screening for cardiovascular disorders: Secondary | ICD-10-CM

## 2020-06-26 DIAGNOSIS — M8580 Other specified disorders of bone density and structure, unspecified site: Secondary | ICD-10-CM

## 2020-06-26 DIAGNOSIS — E559 Vitamin D deficiency, unspecified: Secondary | ICD-10-CM | POA: Diagnosis not present

## 2020-06-26 DIAGNOSIS — Z23 Encounter for immunization: Secondary | ICD-10-CM

## 2020-06-26 LAB — COMPREHENSIVE METABOLIC PANEL
ALT: 23 U/L (ref 0–35)
AST: 21 U/L (ref 0–37)
Albumin: 4.2 g/dL (ref 3.5–5.2)
Alkaline Phosphatase: 66 U/L (ref 39–117)
BUN: 12 mg/dL (ref 6–23)
CO2: 29 mEq/L (ref 19–32)
Calcium: 9.3 mg/dL (ref 8.4–10.5)
Chloride: 104 mEq/L (ref 96–112)
Creatinine, Ser: 0.76 mg/dL (ref 0.40–1.20)
GFR: 78.87 mL/min (ref >60.00)
Glucose, Bld: 98 mg/dL (ref 70–99)
Potassium: 4.7 mEq/L (ref 3.5–5.1)
Sodium: 140 mEq/L (ref 135–145)
Total Bilirubin: 0.5 mg/dL (ref 0.2–1.2)
Total Protein: 7.1 g/dL (ref 6.0–8.3)

## 2020-06-26 LAB — CBC WITH DIFFERENTIAL/PLATELET
Basophils Absolute: 0 10*3/uL (ref 0.0–0.1)
Basophils Relative: 0.6 % (ref 0.0–3.0)
Eosinophils Absolute: 0.1 10*3/uL (ref 0.0–0.7)
Eosinophils Relative: 1.6 % (ref 0.0–5.0)
HCT: 42.3 % (ref 36.0–46.0)
Hemoglobin: 14.1 g/dL (ref 12.0–15.0)
Lymphocytes Relative: 30.4 % (ref 12.0–46.0)
Lymphs Abs: 1.4 10*3/uL (ref 0.7–4.0)
MCHC: 33.4 g/dL (ref 30.0–36.0)
MCV: 97.9 fl (ref 78.0–100.0)
Monocytes Absolute: 0.4 10*3/uL (ref 0.1–1.0)
Monocytes Relative: 9 % (ref 3.0–12.0)
Neutro Abs: 2.6 10*3/uL (ref 1.4–7.7)
Neutrophils Relative %: 58.4 % (ref 43.0–77.0)
Platelets: 261 10*3/uL (ref 150.0–400.0)
RBC: 4.32 Mil/uL (ref 3.87–5.11)
RDW: 14 % (ref 11.5–15.5)
WBC: 4.5 10*3/uL (ref 4.0–10.5)

## 2020-06-26 LAB — LIPID PANEL
Cholesterol: 261 mg/dL — ABNORMAL HIGH (ref 0–200)
HDL: 95 mg/dL (ref >39.00)
LDL Cholesterol: 145 mg/dL — ABNORMAL HIGH (ref 0–99)
NonHDL: 165.65
Total CHOL/HDL Ratio: 3
Triglycerides: 104 mg/dL (ref 0.0–149.0)
VLDL: 20.8 mg/dL (ref 0.0–40.0)

## 2020-06-26 LAB — VITAMIN D 25 HYDROXY (VIT D DEFICIENCY, FRACTURES): VITD: 28.04 ng/mL — ABNORMAL LOW (ref 30.00–100.00)

## 2020-06-26 NOTE — Assessment & Plan Note (Signed)
bmd not due until next year  con't calcium and vita d

## 2020-06-26 NOTE — Assessment & Plan Note (Signed)
Check labs 

## 2020-06-26 NOTE — Progress Notes (Signed)
Subjective:   By signing my name below, I, Margaret Foster, attest that this documentation has been prepared under the direction and in the presence of  Dr. Roma Schanz, DO. 06/26/2020    Patient ID: Margaret Foster, female    DOB: 1950-06-13, 70 y.o.   MRN: [id number]  Chief Complaint  Patient presents with  . Follow-up    Yearly OV- tlc,rma Concerns/ questions:nne    HPI Patient is in today for a office visit. She reports she is doing well. She is UTD on Covid 19 vaccinations. She adds she will be getting her second booster vaccine later today. She has not received her pneumonia vaccine yet but she is willing to get this at a later date after receiving her second Covid 19 booster shot. She uaual participates in moderate exercise at the gym however she mentions that her gym is undergoing renovations. She is taking calcium medication to help manage her osteopenia and low vitamin D levels. She denies any shortness of breath, coughing, fever, tightness, N/V/D, chest pain, dysuria, and headaches at this time.  Past Medical History:  Diagnosis Date  . Chicken pox   . GERD (gastroesophageal reflux disease)   . Heart murmur   . Osteopenia   . Skin cancer (melanoma) (HCC)    Arm  . Skin cancer, basal cell    Face, Shoulder, Back    Past Surgical History:  Procedure Laterality Date  . ABDOMINAL HYSTERECTOMY     Still has Ovaries  . BLADDER SUSPENSION     [place]---- dr Dewayne Hatch  . COLONOSCOPY    . SKIN CANCER EXCISION     x's 6 (Multiple sites)    Family History  Problem Relation Age of Onset  . Arthritis Mother   . Alzheimer's disease Mother   . Kidney disease Mother   . Diabetes Brother   . Stomach cancer Maternal Uncle   . Colon cancer Neg Hx   . Colon polyps Neg Hx   . Esophageal cancer Neg Hx   . Rectal cancer Neg Hx     Social History   Socioeconomic History  . Marital status: Married    Spouse name: Not on file  . Number of children: Not on file  .  Years of education: Not on file  . Highest education level: Not on file  Occupational History    Employer: [employer]    Comment: retired  Tobacco Use  . Smoking status: Never Smoker  . Smokeless tobacco: Never Used  Substance and Sexual Activity  . Alcohol use: Yes    Alcohol/week: 0.0 standard drinks    Comment: Socially  . Drug use: No  . Sexual activity: Yes    Partners: Male  Other Topics Concern  . Not on file  Social History Narrative   Exercise 4x a week   Social Determinants of Health   Financial Resource Strain: Not on file  Food Insecurity: Not on file  Transportation Needs: Not on file  Physical Activity: Not on file  Stress: Not on file  Social Connections: Not on file  Intimate Partner Violence: Not on file    Outpatient Medications Prior to Visit  Medication Sig Dispense Refill  . aspirin 325 MG tablet Take 325 mg by mouth daily.    . Multiple Vitamin (MULTIVITAMIN) capsule Take 1 capsule by mouth daily.     No facility-administered medications prior to visit.    No Known Allergies  Review of Systems  Constitutional: Negative for chills  and fever.  HENT: Negative for congestion, ear pain, sinus pain and sore throat.   Eyes: Negative for pain.  Respiratory: Negative for cough, shortness of breath and wheezing.   Cardiovascular: Negative for chest pain, palpitations and leg swelling.  Gastrointestinal: Negative for abdominal pain, diarrhea, nausea and vomiting.  Genitourinary: Negative for dysuria.  Neurological: Negative for dizziness and headaches.       Objective:    Physical Exam Constitutional:      General: She is not in acute distress.    Appearance: Normal appearance. She is not ill-appearing.  HENT:     Head: Normocephalic and atraumatic.     Right Ear: External ear normal.     Left Ear: External ear normal.  Eyes:     Extraocular Movements: Extraocular movements intact.     Pupils: Pupils are equal, round, and reactive to light.   Cardiovascular:     Rate and Rhythm: Normal rate and regular rhythm.     Pulses: Normal pulses.     Heart sounds: Normal heart sounds. No murmur heard.   Pulmonary:     Effort: Pulmonary effort is normal. No respiratory distress.     Breath sounds: Normal breath sounds. No wheezing, rhonchi or rales.  Skin:    General: Skin is warm and dry.  Neurological:     Mental Status: She is alert and oriented to person, place, and time.  Psychiatric:        Behavior: Behavior normal.     There were no vitals taken for this visit. Wt Readings from Last 3 Encounters:  07/21/19 150 lb (68 kg)  07/18/19 152 lb 8 oz (69.2 kg)  06/17/19 160 lb 3.2 oz (72.7 kg)    Diabetic Foot Exam - Simple   No data filed    Lab Results  Component Value Date   WBC 5.1 10/22/2017   HGB 13.7 10/22/2017   HCT 41.2 10/22/2017   PLT 284.0 10/22/2017   GLUCOSE 103 (H) 06/17/2019   CHOL 277 (H) 06/17/2019   TRIG 123.0 06/17/2019   HDL 87.40 06/17/2019   LDLCALC 165 (H) 06/17/2019   ALT 17 06/17/2019   AST 17 06/17/2019   NA 139 06/17/2019   K 4.0 06/17/2019   CL 104 06/17/2019   CREATININE 0.71 06/17/2019   BUN 11 06/17/2019   CO2 27 06/17/2019    No results found for: TSH Lab Results  Component Value Date   WBC 5.1 10/22/2017   HGB 13.7 10/22/2017   HCT 41.2 10/22/2017   MCV 98.2 10/22/2017   PLT 284.0 10/22/2017   Lab Results  Component Value Date   NA 139 06/17/2019   K 4.0 06/17/2019   CO2 27 06/17/2019   GLUCOSE 103 (H) 06/17/2019   BUN 11 06/17/2019   CREATININE 0.71 06/17/2019   BILITOT 0.7 06/17/2019   ALKPHOS 62 06/17/2019   AST 17 06/17/2019   ALT 17 06/17/2019   PROT 6.9 06/17/2019   ALBUMIN 4.4 06/17/2019   CALCIUM 9.2 06/17/2019   GFR 81.30 06/17/2019   Lab Results  Component Value Date   CHOL 277 (H) 06/17/2019   Lab Results  Component Value Date   HDL 87.40 06/17/2019   Lab Results  Component Value Date   LDLCALC 165 (H) 06/17/2019   Lab Results   Component Value Date   TRIG 123.0 06/17/2019   Lab Results  Component Value Date   CHOLHDL 3 06/17/2019   No results found for: HGBA1C  Assessment & Plan:   Problem List Items Addressed This Visit   None   Visit Diagnoses    Ischemic heart disease screen    -  Primary   Relevant Orders   CBC with Differential/Platelet   Lipid panel   Comprehensive metabolic panel   Osteopenia, unspecified location       Relevant Orders   CBC with Differential/Platelet   Comprehensive metabolic panel   Vitamin D (25 hydroxy)   Vitamin D deficiency       Relevant Orders   Vitamin D (25 hydroxy)       No orders of the defined types were placed in this encounter.   IAnn Held, DO, personally preformed the services described in this documentation.  All medical record entries made by the scribe were at my direction and in my presence.  I have reviewed the chart and discharge instructions (if applicable) and agree that the record reflects my personal performance and is accurate and complete. 06/26/2020   I,Margaret Foster,acting as a scribe for Ann Held, DO.,have documented all relevant documentation on the behalf of Ann Held, DO,as directed by  Ann Held, DO while in the presence of [place], DO, have reviewed all documentation for this visit. The documentation on 06/26/20 for the exam, diagnosis, procedures, and orders are all accurate and complete.   Ann Held, DO

## 2020-06-26 NOTE — Progress Notes (Signed)
   Covid-19 Vaccination Clinic  Name:  Wynnie Pacetti    MRN: [id number] DOB: 03-25-1950  06/26/2020  Ms. Eastep was observed post Covid-19 immunization for 15 minutes without incident. She was provided with Vaccine Information Sheet and instruction to access the V-Safe system.   Ms. Baysinger was instructed to call 911 with any severe reactions post vaccine: Marland Kitchen Difficulty breathing  . Swelling of face and throat  . A fast heartbeat  . A bad rash all over body  . Dizziness and weakness   Immunizations Administered    Name Date Dose VIS Date Route   PFIZER Comrnaty(Gray TOP) Covid-19 Vaccine 06/26/2020 11:48 AM 0.3 mL 02/16/2020 Intramuscular   Manufacturer: [place]   Lot: [id number]   NDC: [phone]

## 2020-06-26 NOTE — Patient Instructions (Signed)
Osteopenia  Osteopenia is a loss of thickness (density) inside the bones. Another name for osteopenia is low bone mass. Mild osteopenia is a normal part of aging. It is not a disease, and it does not cause symptoms. However, if you have osteopenia and continue to lose bone mass, you could develop a condition that causes the bones to become thin and break more easily (osteoporosis). Osteoporosis can cause you to lose some height, have back pain, and have a stooped posture. Although osteopenia is not a disease, making changes to your lifestyle and diet can help to prevent osteopenia from developing into osteoporosis. What are the causes? Osteopenia is caused by loss of calcium in the bones. Bones are constantly changing. Old bone cells are continually being replaced with new bone cells. This process builds new bone. The mineral calcium is needed to build new bone and maintain bone density. Bone density is usually highest around age 35. After that, most people's bodies cannot replace all the bone they have lost with new bone. What increases the risk? You are more likely to develop this condition if:  You are older than age 50.  You are a woman who went through menopause early.  You have a long illness that keeps you in bed.  You do not get enough exercise.  You lack certain nutrients (malnutrition).  You have an overactive thyroid gland (hyperthyroidism).  You use products that contain nicotine or tobacco, such as cigarettes, e-cigarettes and chewing tobacco, or you drink a lot of alcohol.  You are taking medicines that weaken the bones, such as steroids. What are the signs or symptoms? This condition does not cause any symptoms. You may have a slightly higher risk for bone breaks (fractures), so getting fractures more easily than normal may be an indication of osteopenia. How is this diagnosed? This condition may be diagnosed based on an X-ray exam that measures bone density (dual-energy  X-ray absorptiometry, or DEXA). This test can measure bone density in your hips, spine, and wrists. Osteopenia has no symptoms, so this condition is usually diagnosed after a routine bone density screening test is done for osteoporosis. This routine screening is usually done for:  Women who are age 65 or older.  Men who are age 70 or older. If you have risk factors for osteopenia, you may have the screening test at an earlier age. How is this treated? Making dietary and lifestyle changes can lower your risk for osteoporosis. If you have severe osteopenia that is close to becoming osteoporosis, this condition can be treated with medicines and dietary supplements such as calcium and vitamin D. These supplements help to rebuild bone density. Follow these instructions at home: Eating and drinking Eat a diet that is high in calcium and vitamin D.  Calcium is found in dairy products, beans, salmon, and leafy green vegetables like spinach and broccoli.  Look for foods that have vitamin D and calcium added to them (fortified foods), such as orange juice, cereal, and bread.   Lifestyle  Do 30 minutes or more of a weight-bearing exercise every day, such as walking, jogging, or playing a sport. These types of exercises strengthen the bones.  Do not use any products that contain nicotine or tobacco, such as cigarettes, e-cigarettes, and chewing tobacco. If you need help quitting, ask your health care provider.  Do not drink alcohol if: ? Your health care provider tells you not to drink. ? You are pregnant, may be pregnant, or are planning to become   pregnant.  If you drink alcohol: ? Limit how much you use to:  0-1 drink a day for women.  0-2 drinks a day for men. ? Be aware of how much alcohol is in your drink. In the [place] one drink equals one 12 oz bottle of beer (355 mL), one 5 oz glass of wine (148 mL), or one 1 oz glass of hard liquor (44 mL). General instructions  Take over-the-counter  and prescription medicines only as told by your health care provider. These include vitamins and supplements.  Take precautions at home to lower your risk of falling, such as: ? Keeping rooms well-lit and free of clutter, such as cords. ? Installing safety rails on stairs. ? Using rubber mats in the bathroom or other areas that are often wet or slippery.  Keep all follow-up visits. This is important. Contact a health care provider if:  You have not had a bone density screening for osteoporosis and you are: ? A woman who is age 65 or older. ? A man who is age 70 or older.  You are a postmenopausal woman who has not had a bone density screening for osteoporosis.  You are older than age 50 and you want to know if you should have bone density screening for osteoporosis. Summary  Osteopenia is a loss of thickness (density) inside the bones. Another name for osteopenia is low bone mass.  Osteopenia is not a disease, but it may increase your risk for a condition that causes the bones to become thin and break more easily (osteoporosis).  You may be at risk for osteopenia if you are older than age 50 or if you are a woman who went through early menopause.  Osteopenia does not cause any symptoms, but it can be diagnosed with a bone density screening test.  Dietary and lifestyle changes are the first treatment for osteopenia. These may lower your risk for osteoporosis. This information is not intended to replace advice given to you by your health care provider. Make sure you discuss any questions you have with your health care provider. Document Revised: 08/11/2019 Document Reviewed: 08/11/2019 Elsevier Patient Education  2021 Elsevier Inc.  

## 2020-06-27 ENCOUNTER — Other Ambulatory Visit: Payer: Self-pay | Admitting: Family Medicine

## 2020-06-27 ENCOUNTER — Other Ambulatory Visit: Payer: Self-pay

## 2020-06-27 DIAGNOSIS — E559 Vitamin D deficiency, unspecified: Secondary | ICD-10-CM

## 2020-06-27 DIAGNOSIS — E785 Hyperlipidemia, unspecified: Secondary | ICD-10-CM

## 2020-06-27 MED ORDER — VITAMIN D (ERGOCALCIFEROL) 1.25 MG (50000 UNIT) PO CAPS: 50000.0000 [IU] | ORAL_CAPSULE | ORAL | 2 refills | Status: AC

## 2020-06-29 ENCOUNTER — Other Ambulatory Visit (HOSPITAL_BASED_OUTPATIENT_CLINIC_OR_DEPARTMENT_OTHER): Payer: Self-pay

## 2020-06-29 DIAGNOSIS — Z23 Encounter for immunization: Secondary | ICD-10-CM | POA: Diagnosis not present

## 2020-06-29 MED ORDER — PFIZER-BIONT COVID-19 VAC-TRIS 30 MCG/0.3ML IM SUSP
INTRAMUSCULAR | 0 refills | Status: AC
  Filled 2020-06-29: qty 0.3, 1d supply, fill #0

## 2020-07-03 ENCOUNTER — Other Ambulatory Visit: Payer: Self-pay

## 2020-07-03 ENCOUNTER — Ambulatory Visit (INDEPENDENT_AMBULATORY_CARE_PROVIDER_SITE_OTHER): Payer: Medicare Other | Admitting: Family Medicine

## 2020-07-03 ENCOUNTER — Encounter: Payer: Self-pay | Admitting: Family Medicine

## 2020-07-03 VITALS — BP 160/102 | HR 83 | Temp 98.1°F | Ht 62.0 in | Wt 159.1 lb

## 2020-07-03 DIAGNOSIS — R0789 Other chest pain: Secondary | ICD-10-CM | POA: Diagnosis not present
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
## 2020-07-03 NOTE — Patient Instructions (Addendum)
Ice/cold pack over area for 10-15 min twice daily.  Heat (pad or rice pillow in microwave) over affected area, 10-15 minutes twice daily.   OK to take Tylenol 1000 mg (2 extra strength tabs) or 975 mg (3 regular strength tabs) every 6 hours as needed.  Ibuprofen 400-600 mg (2-3 over the counter strength tabs) every 6 hours as needed for pain.  Take deep breaths routinely. If you get shortness of breath, fevers or cough, let me know.  Let us know if you need anything.  Pectoralis Major Rehab Ask your health care provider which exercises are safe for you. Do exercises exactly as told by your health care provider and adjust them as directed. It is normal to feel mild stretching, pulling, tightness, or discomfort as you do these exercises, but you should stop right away if you feel sudden pain or your pain gets worse.Do not begin these exercises until told by your health care provider. Stretching and range of motion exercises These exercises warm up your muscles and joints and improve the movement and flexibility of your shoulder. These exercises can also help to relieve pain, numbness, and tingling. Exercise A: Pendulum  1. Stand near a wall or a surface that you can hold onto for balance. 2. Bend at the waist and let your left / right arm hang straight down. Use your other arm to keep your balance. 3. Relax your arm and shoulder muscles, and move your hips and your trunk so your left / right arm swings freely. Your arm should swing because of the motion of your body, not because you are using your arm or shoulder muscles. 4. Keep moving so your arm swings in the following directions, as told by your health care provider: ? Side to side. ? Forward and backward. ? In clockwise and counterclockwise circles. 5. Slowly return to the starting position. Repeat 2 times. Complete this exercise 3 times per week. Exercise B: Abduction, standing 1. Stand and hold a broomstick, a cane, or a similar  object. Place your hands a little more than shoulder-width apart on the object. Your left / right hand should be palm-up, and your other hand should be palm-down. 2. While keeping your elbow straight and your shoulder muscles relaxed, push the stick across your body toward your left / right side. Raise your left / right arm to the side of your body and then over your head until you feel a stretch in your shoulder. ? Stop when you reach the angle that is recommended by your health care provider. ? Avoid shrugging your shoulder while you raise your arm. Keep your shoulder blade tucked down toward the middle of your spine. 3. Hold for 10 seconds. 4. Slowly return to the starting position. Repeat 2 times. Complete this exercise 3 times per week. Exercise C: Wand flexion, supine  1. Lie on your back. You may bend your knees for comfort. 2. Hold a broomstick, a cane, or a similar object so that your hands are about shoulder-width apart on the object. Your palms should face toward your feet. 3. Raise your left / right arm in front of your face, then behind your head (toward the floor). Use your other hand to help you do this. Stop when you feel a gentle stretch in your shoulder, or when you reach the angle that is recommended by your health care provider. 4. Hold for 3 seconds. 5. Use the broomstick and your other arm to help you return your left /  right arm to the starting position. Repeat 2 times. Complete this exercise 3 times per week. Exercise D: Wand shoulder external rotation 1. Stand and hold a broomstick, a cane, or a similar object so your handsare about shoulder-width apart on the object. 2. Start with your arms hanging down, then bend both elbows to an "L" shape (90 degrees). 3. Keep your left / right elbow at your side. Use your other hand to push the stick so your left / right forearm moves away from your body, out to your side. ? Keep your left / right elbow bent to 90 degrees and keep it  against your side. ? Stop when you feel a gentle stretch in your shoulder, or when you reach the angle recommended by your health care provider. 4. Hold for 10 seconds. 5. Use the stick to help you return your left / right arm to the starting position. Repeat 2 times. Complete this exercise 3 times per week. Strengthening exercises These exercises build strength and endurance in your shoulder. Endurance is the ability to use your muscles for a long time, even after your muscles get tired. Exercise E: Scapular protraction, standing 1. Stand so you are facing a wall. Place your feet about one arm-length away from the wall. 2. Place your hands on the wall and straighten your elbows. 3. Keep your hands on the wall as you push your upper back away from the wall. You should feel your shoulder blades sliding forward.Keep your elbows and your head still. ? If you are not sure that you are doing this exercise correctly, ask your health care provider for more instructions. 4. Hold for 3 seconds. 5. Slowly return to the starting position. Let your muscles relax completely before you repeat this exercise. Repeat 2 times. Complete this exercise 3 times per week. Exercise F: Shoulder blade squeezes  (scapular retraction) 1. Sit with good posture in a stable chair. Do not let your back touch the back of the chair. 2. Your arms should be at your sides with your elbows bent. You may rest your forearms on a pillow if that is more comfortable. 3. Squeeze your shoulder blades together. Bring them down and back. ? Keep your shoulders level. ? Do not lift your shoulders up toward your ears. 4. Hold for 3 seconds. 5. Return to the starting position. Repeat 2 times. Complete this exercise 3 times per week. This information is not intended to replace advice given to you by your health care provider. Make sure you discuss any questions you have with your health care provider. Document Released: 02/24/2005 Document  Revised: 12/06/2015 Document Reviewed: 11/12/2014 [employer]vier Interactive Patient Education  [employer].

## 2020-11-13 ENCOUNTER — Other Ambulatory Visit (HOSPITAL_BASED_OUTPATIENT_CLINIC_OR_DEPARTMENT_OTHER): Payer: Self-pay

## 2020-11-19 DIAGNOSIS — Z1211 Encounter for screening for malignant neoplasm of colon: Secondary | ICD-10-CM | POA: Diagnosis not present

## 2020-11-19 DIAGNOSIS — Z23 Encounter for immunization: Secondary | ICD-10-CM | POA: Diagnosis not present

## 2020-11-19 DIAGNOSIS — E785 Hyperlipidemia, unspecified: Secondary | ICD-10-CM | POA: Diagnosis not present

## 2020-11-19 DIAGNOSIS — R42 Dizziness and giddiness: Secondary | ICD-10-CM | POA: Diagnosis not present

## 2020-12-03 DIAGNOSIS — Z1231 Encounter for screening mammogram for malignant neoplasm of breast: Secondary | ICD-10-CM | POA: Diagnosis not present

## 2020-12-03 DIAGNOSIS — Z23 Encounter for immunization: Secondary | ICD-10-CM | POA: Diagnosis not present

## 2020-12-17 DIAGNOSIS — Z780 Asymptomatic menopausal state: Secondary | ICD-10-CM | POA: Diagnosis not present

## 2020-12-17 DIAGNOSIS — E785 Hyperlipidemia, unspecified: Secondary | ICD-10-CM | POA: Diagnosis not present

## 2020-12-17 DIAGNOSIS — E559 Vitamin D deficiency, unspecified: Secondary | ICD-10-CM | POA: Diagnosis not present

## 2020-12-17 DIAGNOSIS — Z0000 Encounter for general adult medical examination without abnormal findings: Secondary | ICD-10-CM | POA: Diagnosis not present

## 2020-12-17 DIAGNOSIS — R739 Hyperglycemia, unspecified: Secondary | ICD-10-CM | POA: Diagnosis not present

## 2020-12-19 DIAGNOSIS — Z0000 Encounter for general adult medical examination without abnormal findings: Secondary | ICD-10-CM | POA: Diagnosis not present

## 2020-12-19 DIAGNOSIS — Z23 Encounter for immunization: Secondary | ICD-10-CM | POA: Diagnosis not present

## 2021-06-05 DIAGNOSIS — M1710 Unilateral primary osteoarthritis, unspecified knee: Secondary | ICD-10-CM | POA: Diagnosis not present

## 2021-06-05 DIAGNOSIS — M1711 Unilateral primary osteoarthritis, right knee: Secondary | ICD-10-CM | POA: Diagnosis not present

## 2021-06-05 DIAGNOSIS — M25561 Pain in right knee: Secondary | ICD-10-CM | POA: Diagnosis not present

## 2021-06-28 ENCOUNTER — Encounter: Payer: Medicare Other | Admitting: Family Medicine

## 2021-07-25 DIAGNOSIS — C44319 Basal cell carcinoma of skin of other parts of face: Secondary | ICD-10-CM | POA: Diagnosis not present

## 2021-08-29 DIAGNOSIS — C44329 Squamous cell carcinoma of skin of other parts of face: Secondary | ICD-10-CM | POA: Diagnosis not present

## 2021-09-23 DIAGNOSIS — C44329 Squamous cell carcinoma of skin of other parts of face: Secondary | ICD-10-CM | POA: Diagnosis not present

## 2021-10-08 DIAGNOSIS — Z4802 Encounter for removal of sutures: Secondary | ICD-10-CM | POA: Diagnosis not present

## 2021-12-10 DIAGNOSIS — Z23 Encounter for immunization: Secondary | ICD-10-CM | POA: Diagnosis not present

## 2021-12-24 DIAGNOSIS — Z1211 Encounter for screening for malignant neoplasm of colon: Secondary | ICD-10-CM | POA: Diagnosis not present

## 2021-12-24 DIAGNOSIS — Z85828 Personal history of other malignant neoplasm of skin: Secondary | ICD-10-CM | POA: Diagnosis not present

## 2021-12-24 DIAGNOSIS — H6122 Impacted cerumen, left ear: Secondary | ICD-10-CM | POA: Diagnosis not present

## 2021-12-24 DIAGNOSIS — E7800 Pure hypercholesterolemia, unspecified: Secondary | ICD-10-CM | POA: Diagnosis not present

## 2021-12-24 DIAGNOSIS — Z0000 Encounter for general adult medical examination without abnormal findings: Secondary | ICD-10-CM | POA: Diagnosis not present

## 2021-12-24 DIAGNOSIS — R7301 Impaired fasting glucose: Secondary | ICD-10-CM | POA: Diagnosis not present

## 2021-12-24 DIAGNOSIS — Z79899 Other long term (current) drug therapy: Secondary | ICD-10-CM | POA: Diagnosis not present

## 2021-12-24 DIAGNOSIS — R42 Dizziness and giddiness: Secondary | ICD-10-CM | POA: Diagnosis not present

## 2021-12-24 DIAGNOSIS — Z23 Encounter for immunization: Secondary | ICD-10-CM | POA: Diagnosis not present

## 2021-12-24 DIAGNOSIS — M8589 Other specified disorders of bone density and structure, multiple sites: Secondary | ICD-10-CM | POA: Diagnosis not present

## 2021-12-24 DIAGNOSIS — N819 Female genital prolapse, unspecified: Secondary | ICD-10-CM | POA: Diagnosis not present

## 2022-01-13 DIAGNOSIS — Z1211 Encounter for screening for malignant neoplasm of colon: Secondary | ICD-10-CM | POA: Diagnosis not present

## 2022-02-10 DIAGNOSIS — Z85828 Personal history of other malignant neoplasm of skin: Secondary | ICD-10-CM | POA: Diagnosis not present

## 2022-02-10 DIAGNOSIS — L905 Scar conditions and fibrosis of skin: Secondary | ICD-10-CM | POA: Diagnosis not present

## 2022-02-13 DIAGNOSIS — R42 Dizziness and giddiness: Secondary | ICD-10-CM | POA: Diagnosis not present

## 2022-02-25 DIAGNOSIS — M8588 Other specified disorders of bone density and structure, other site: Secondary | ICD-10-CM | POA: Diagnosis not present

## 2022-02-25 DIAGNOSIS — M810 Age-related osteoporosis without current pathological fracture: Secondary | ICD-10-CM | POA: Diagnosis not present

## 2022-02-25 DIAGNOSIS — Z1231 Encounter for screening mammogram for malignant neoplasm of breast: Secondary | ICD-10-CM | POA: Diagnosis not present

## 2022-02-25 DIAGNOSIS — R92333 Mammographic heterogeneous density, bilateral breasts: Secondary | ICD-10-CM | POA: Diagnosis not present

## 2022-02-25 DIAGNOSIS — M85851 Other specified disorders of bone density and structure, right thigh: Secondary | ICD-10-CM | POA: Diagnosis not present

## 2022-04-09 IMAGING — MG DIGITAL SCREENING BILAT W/ TOMO W/ CAD
6 of 10 series · 6 of 30 positions shown · non-contrast
Comparison: Previous exam(s).

CLINICAL DATA: Screening.

EXAM:
DIGITAL SCREENING BILATERAL MAMMOGRAM WITH TOMO AND CAD

[L CC synth-2D]
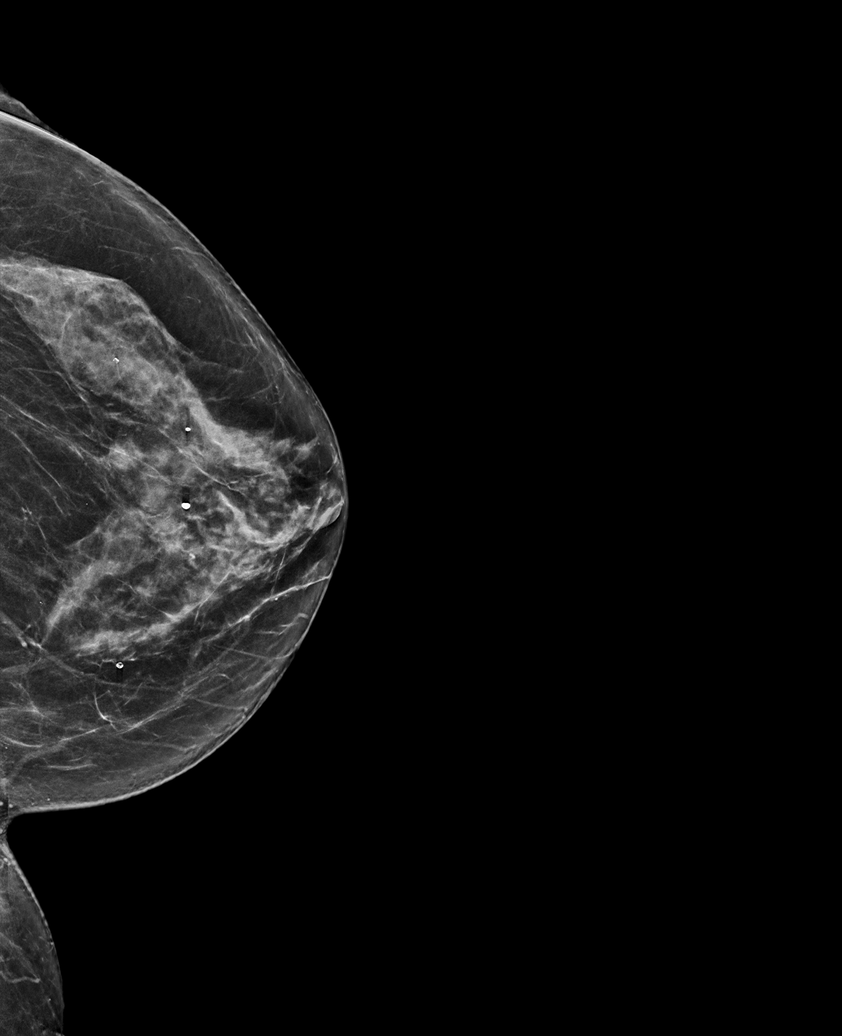

[L MLO synth-2D (1 of 2)]
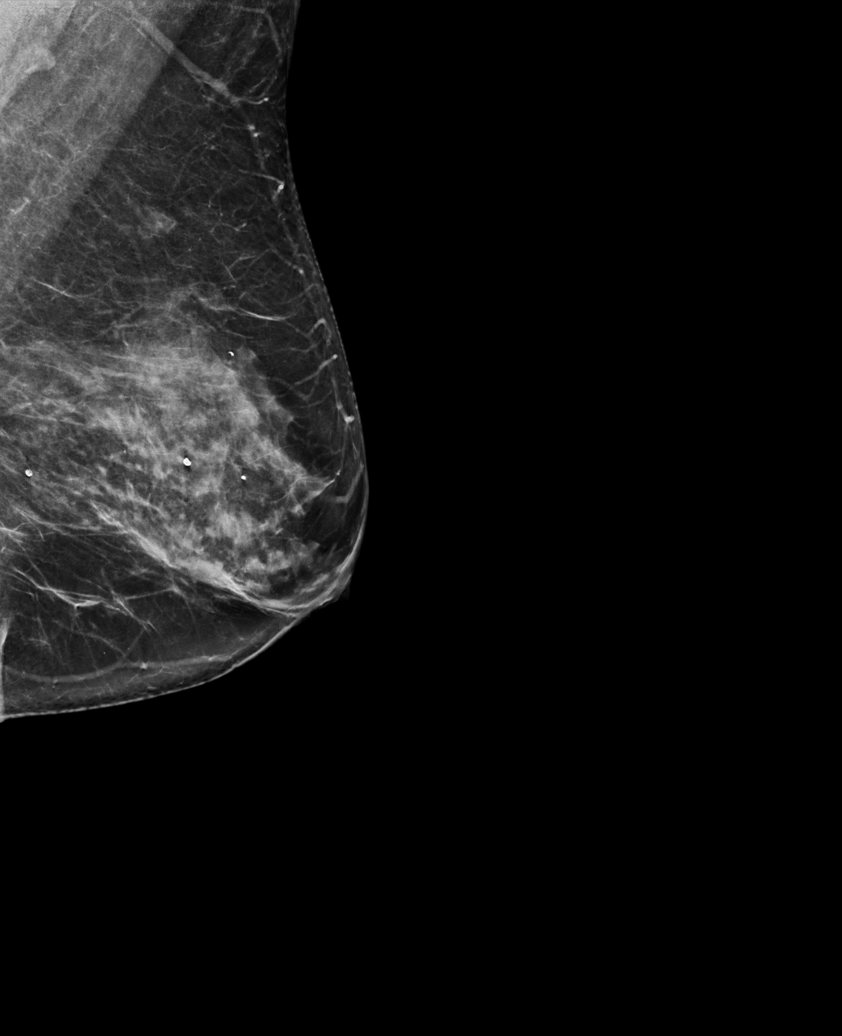

[L MLO synth-2D (2 of 2)]
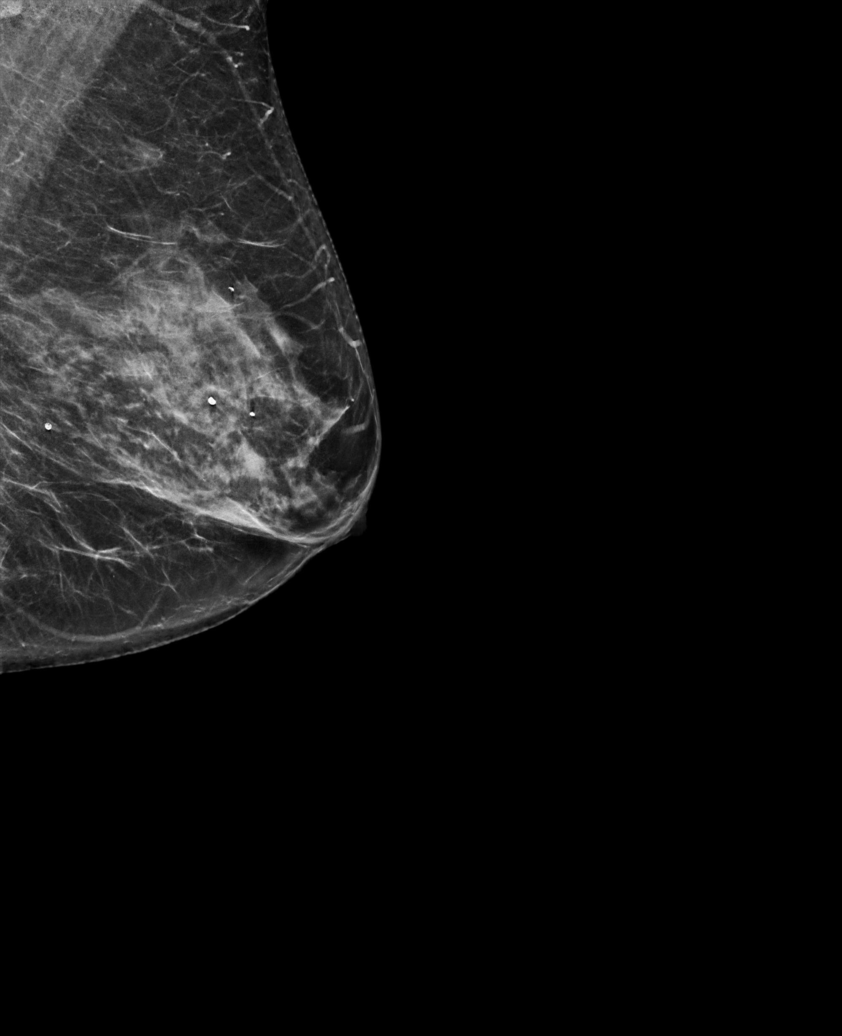

[R CC synth-2D]
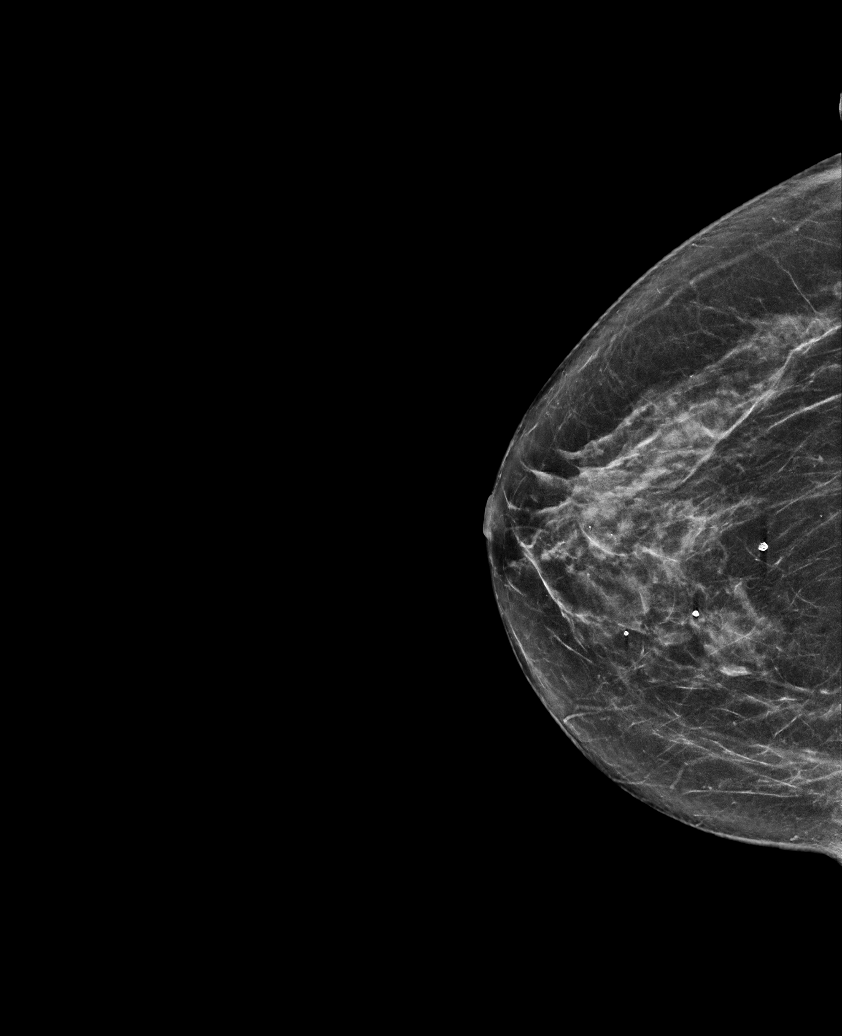

[R MLO synth-2D]
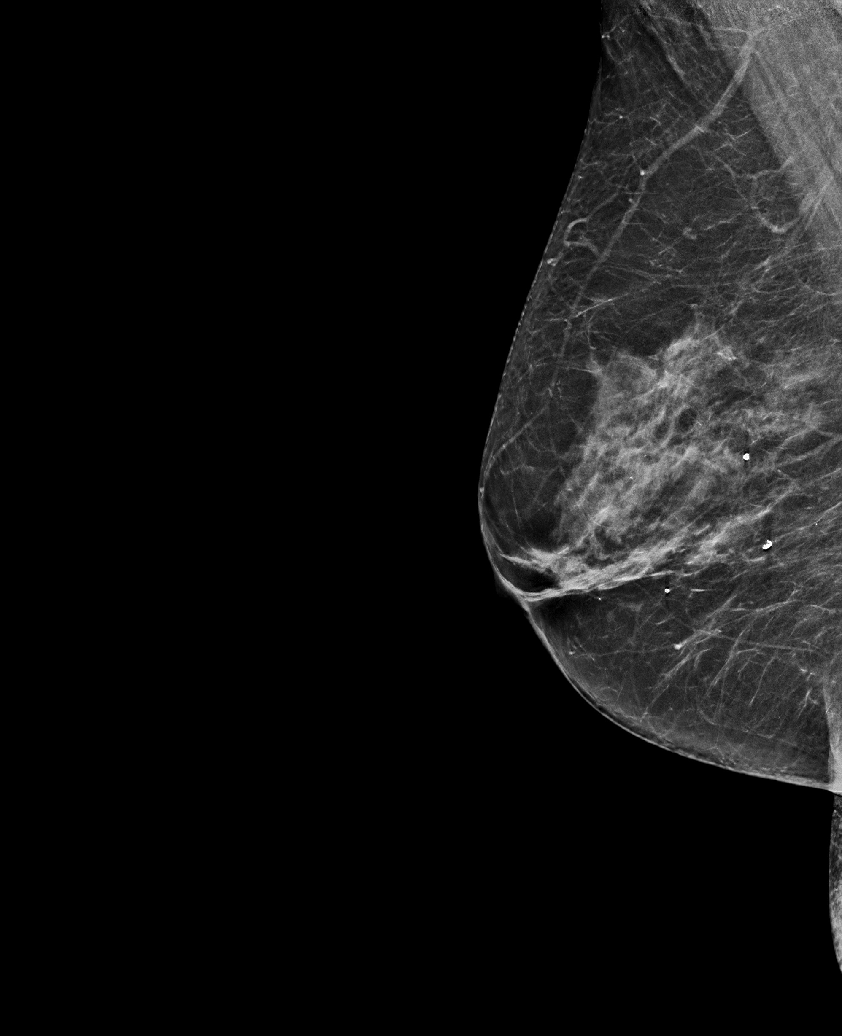

[L CC tomo · tomo slice 34/67.0]
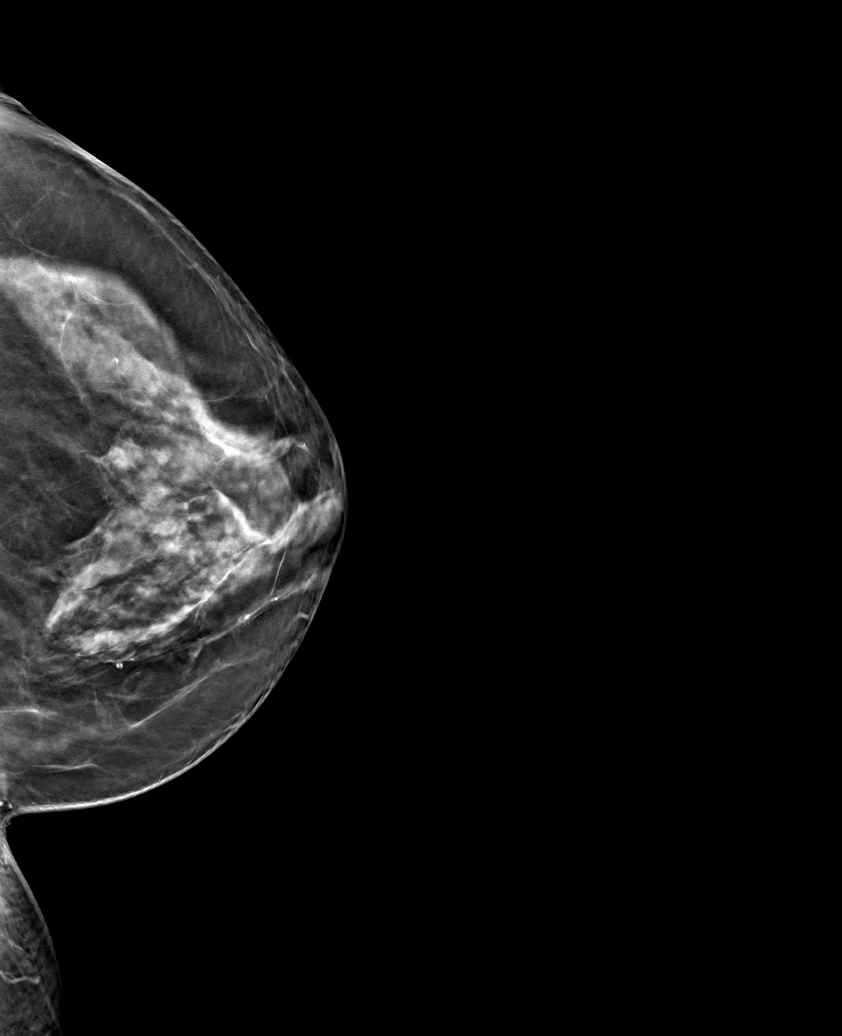

[6 of 30 positions shown; findings below may reference images not displayed]

ACR Breast Density Category c: The breast tissue is heterogeneously
dense, which may obscure small masses.
FINDINGS: There are no findings suspicious for malignancy. Images were
processed with CAD.
IMPRESSION: No mammographic evidence of malignancy. A result letter of this
screening mammogram will be mailed directly to the patient.

RECOMMENDATION:
Screening mammogram in one year. (Code:[id number]-U-LHB)

BI-RADS CATEGORY  1: Negative.

## 2022-05-01 IMAGING — DX DG FOOT COMPLETE 3+V*L*
3 series · 3 of 3 positions shown · non-contrast
Comparison: 07/18/2019

CLINICAL DATA: Left foot pain. Follow-up 5th toe fracture.

EXAM:
LEFT FOOT - COMPLETE 3+ VIEW

[foot ap]
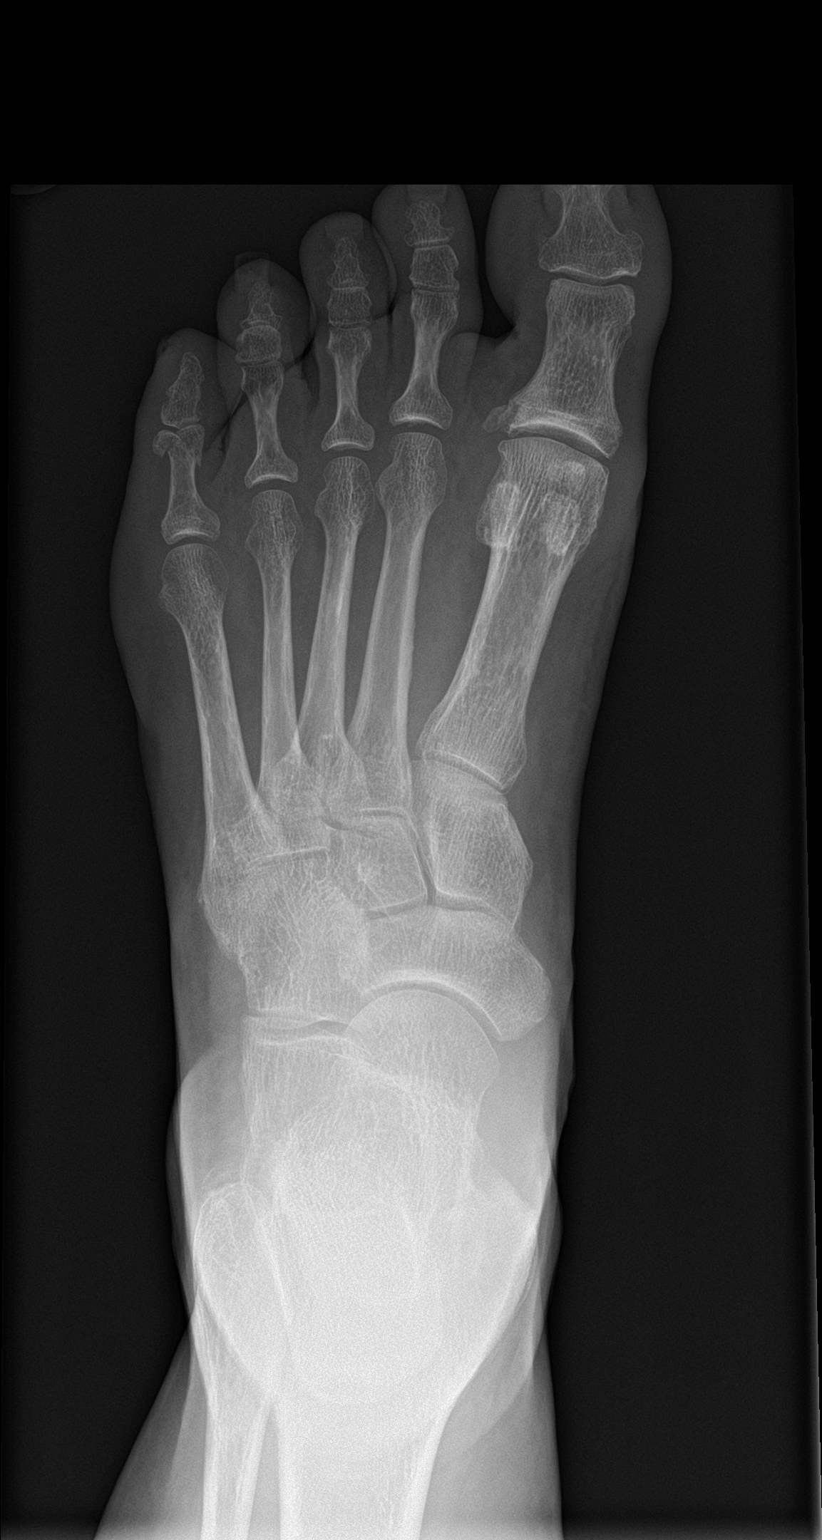

[foot obl]
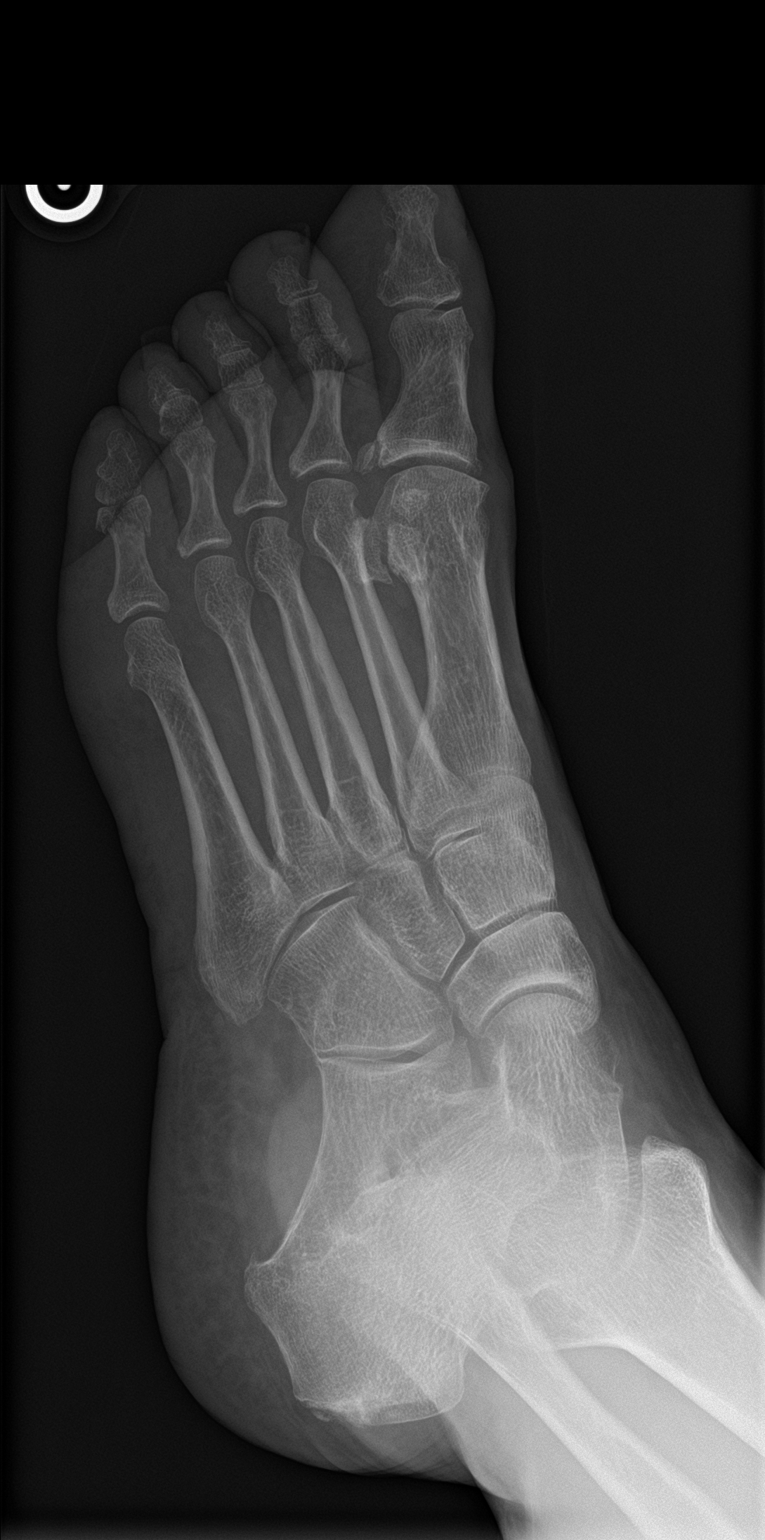

[foot lat]
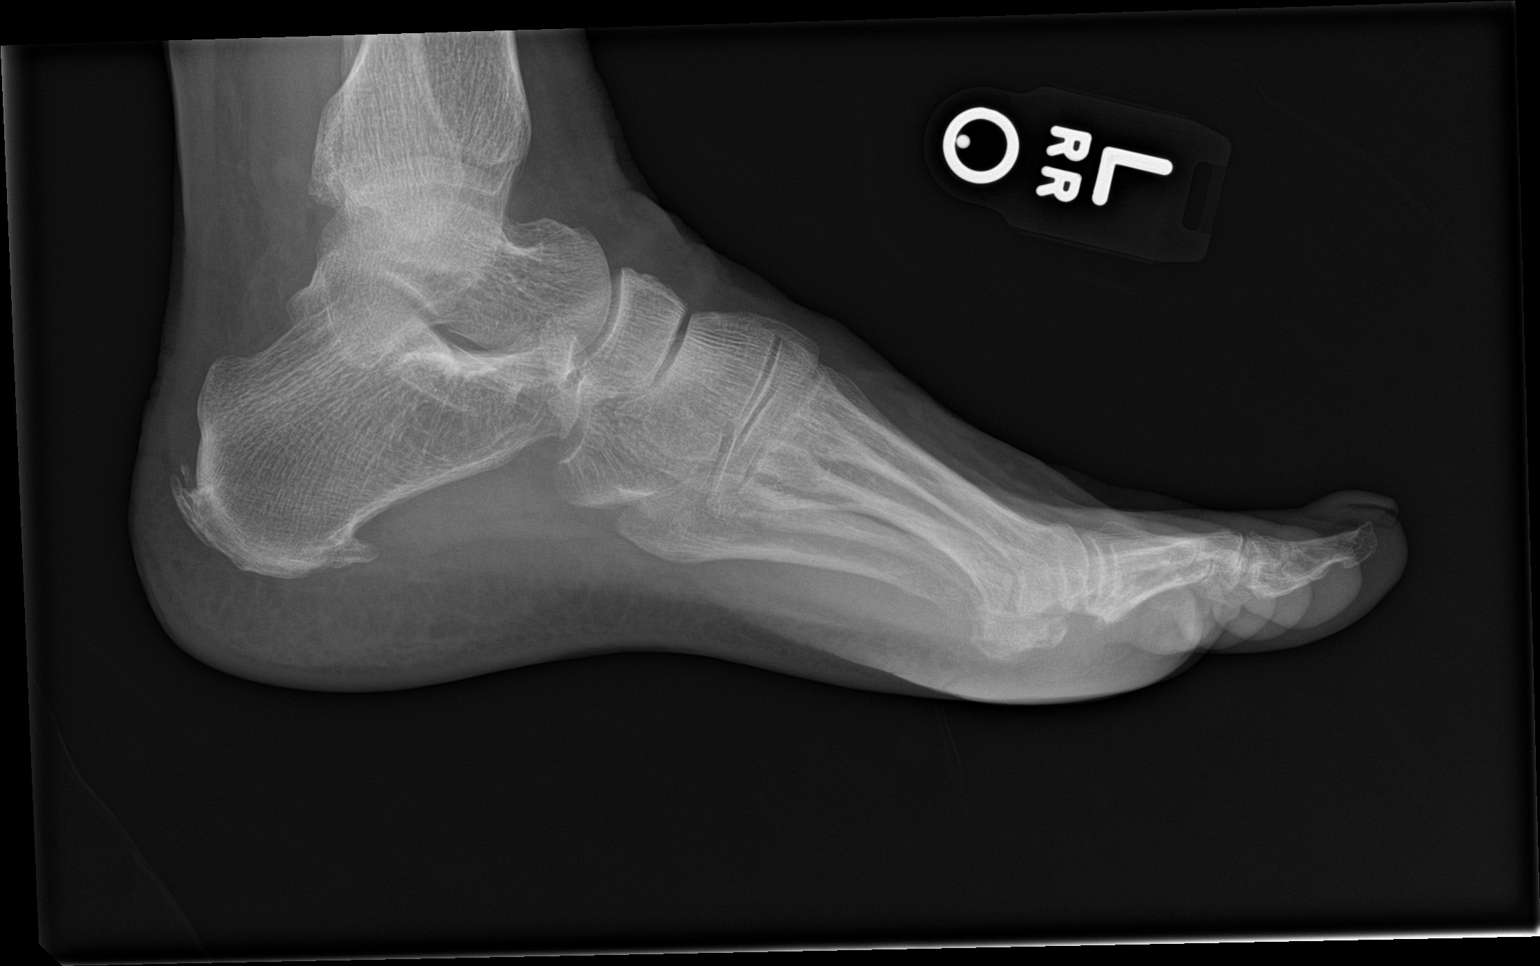

[3 of 3 positions shown; findings below may reference images not displayed]

FINDINGS: Again demonstrated is a comminuted fracture of the distal aspect of
the 5th proximal phalanx with intra-articular involvement. No
interval displacement. No additional fractures seen. Mild 1st MTP
joint degenerative changes and moderate calcaneal enthesophyte
formation.
IMPRESSION: Stable comminuted fracture of the distal aspect of the 5th proximal
phalanx with intra-articular involvement.

## 2022-06-23 ENCOUNTER — Encounter: Payer: Self-pay | Admitting: *Deleted
